# Patient Record
Sex: Female | Born: 1955 | Race: White | Hispanic: No | Marital: Married | Smoking: Former smoker
Health system: Southern US, Community
[De-identification: ages and names within clinical notes are randomized; demographics above are authoritative.]

## PROBLEM LIST (undated history)

## (undated) DIAGNOSIS — E7800 Pure hypercholesterolemia, unspecified: Secondary | ICD-10-CM

## (undated) DIAGNOSIS — M779 Enthesopathy, unspecified: Secondary | ICD-10-CM

## (undated) DIAGNOSIS — M545 Low back pain, unspecified: Secondary | ICD-10-CM

## (undated) DIAGNOSIS — M778 Other enthesopathies, not elsewhere classified: Secondary | ICD-10-CM

## (undated) DIAGNOSIS — C801 Malignant (primary) neoplasm, unspecified: Secondary | ICD-10-CM

## (undated) DIAGNOSIS — E039 Hypothyroidism, unspecified: Secondary | ICD-10-CM

## (undated) DIAGNOSIS — G629 Polyneuropathy, unspecified: Secondary | ICD-10-CM

## (undated) DIAGNOSIS — E785 Hyperlipidemia, unspecified: Secondary | ICD-10-CM

## (undated) HISTORY — DX: Hypothyroidism, unspecified: E03.9

## (undated) HISTORY — PX: WISDOM TOOTH EXTRACTION: SHX21

## (undated) HISTORY — PX: MASTECTOMY: SHX3

## (undated) HISTORY — DX: Hyperlipidemia, unspecified: E78.5

---

## 1993-06-07 HISTORY — PX: TUBAL LIGATION: SHX77

## 2006-09-14 ENCOUNTER — Ambulatory Visit: Payer: Self-pay | Admitting: Obstetrics and Gynecology

## 2010-12-29 ENCOUNTER — Ambulatory Visit: Payer: Self-pay | Admitting: Internal Medicine

## 2010-12-29 ENCOUNTER — Emergency Department: Payer: Self-pay | Admitting: *Deleted

## 2011-03-28 ENCOUNTER — Ambulatory Visit: Payer: Self-pay

## 2011-10-31 ENCOUNTER — Ambulatory Visit: Payer: Self-pay | Admitting: Family Medicine

## 2011-10-31 LAB — WET PREP, GENITAL

## 2013-09-21 ENCOUNTER — Ambulatory Visit: Payer: Self-pay | Admitting: General Practice

## 2015-02-27 ENCOUNTER — Other Ambulatory Visit: Payer: Self-pay | Admitting: Physician Assistant

## 2015-02-27 DIAGNOSIS — Z1231 Encounter for screening mammogram for malignant neoplasm of breast: Secondary | ICD-10-CM

## 2015-03-03 ENCOUNTER — Telehealth: Payer: Self-pay | Admitting: Gastroenterology

## 2015-03-03 ENCOUNTER — Other Ambulatory Visit: Payer: Self-pay

## 2015-03-03 NOTE — Telephone Encounter (Signed)
-----   Message from Evette Cristal sent at 02/27/2015 10:46 AM EDT ----- Colon triage

## 2015-03-03 NOTE — Telephone Encounter (Signed)
Gastroenterology Pre-Procedure Review  Request Date:  Requesting Physician: Dr.   PATIENT REVIEW QUESTIONS: The patient responded to the following health history questions as indicated:    1. Are you having any GI issues? no 2. Do you have a personal history of Polyps? no 3. Do you have a family history of Colon Cancer or Polyps? yes (Mother polyps) 4. Diabetes Mellitus? no 5. Joint replacements in the past 12 months?no 6. Major health problems in the past 3 months?no 7. Any artificial heart valves, MVP, or defibrillator?no    MEDICATIONS & ALLERGIES:    Patient reports the following regarding taking any anticoagulation/antiplatelet therapy:   Plavix, Coumadin, Eliquis, Xarelto, Lovenox, Pradaxa, Brilinta, or Effient? no Aspirin? no  Patient confirms/reports the following medications:  Synthroid Vit D once a week No current outpatient prescriptions on file.   No current facility-administered medications for this visit.    Patient confirms/reports the following allergies:  Allergies not on file  No orders of the defined types were placed in this encounter.    AUTHORIZATION INFORMATION Primary Insurance: 1D#: Group #:  Secondary Insurance: 1D#: Group #:  SCHEDULE INFORMATION: Date:  Time: Location:

## 2015-03-04 ENCOUNTER — Ambulatory Visit
Admission: RE | Admit: 2015-03-04 | Discharge: 2015-03-04 | Disposition: A | Discharge status: Home / Self Care | Payer: PRIVATE HEALTH INSURANCE | Source: Ambulatory Visit | Attending: Physician Assistant | Admitting: Physician Assistant

## 2015-03-04 DIAGNOSIS — Z1231 Encounter for screening mammogram for malignant neoplasm of breast: Secondary | ICD-10-CM | POA: Diagnosis not present

## 2015-03-05 ENCOUNTER — Other Ambulatory Visit: Payer: Self-pay | Admitting: Physician Assistant

## 2015-03-05 DIAGNOSIS — R928 Other abnormal and inconclusive findings on diagnostic imaging of breast: Secondary | ICD-10-CM

## 2015-03-05 DIAGNOSIS — N63 Unspecified lump in unspecified breast: Secondary | ICD-10-CM

## 2015-03-06 ENCOUNTER — Encounter: Payer: Self-pay | Admitting: Physician Assistant

## 2015-03-06 ENCOUNTER — Telehealth: Payer: Self-pay | Admitting: Physician Assistant

## 2015-03-06 ENCOUNTER — Ambulatory Visit: Payer: Self-pay | Admitting: Physician Assistant

## 2015-03-06 VITALS — BP 120/70 | HR 81 | Temp 98.6°F

## 2015-03-06 DIAGNOSIS — E785 Hyperlipidemia, unspecified: Secondary | ICD-10-CM

## 2015-03-06 DIAGNOSIS — N63 Unspecified lump in unspecified breast: Secondary | ICD-10-CM

## 2015-03-06 DIAGNOSIS — E7800 Pure hypercholesterolemia, unspecified: Secondary | ICD-10-CM

## 2015-03-06 DIAGNOSIS — E039 Hypothyroidism, unspecified: Secondary | ICD-10-CM | POA: Insufficient documentation

## 2015-03-06 DIAGNOSIS — I749 Embolism and thrombosis of unspecified artery: Secondary | ICD-10-CM

## 2015-03-06 DIAGNOSIS — H34211 Partial retinal artery occlusion, right eye: Secondary | ICD-10-CM | POA: Insufficient documentation

## 2015-03-06 MED ORDER — SIMVASTATIN 10 MG PO TABS: 10.0000 mg | ORAL_TABLET | Freq: Every day | ORAL | Status: DC

## 2015-03-06 NOTE — Progress Notes (Signed)
S:  Pt here to go over labs and discuss recent dx of Hollenh[place] plaque in r eye, also mammogram results. Pt has appointment for screening colonoscopy, had carotid US today, needs order for echocardiogram, we have also scheduled f/u of breast mass on mammogram,   O: vitals wnl, lungs c t a, cv rrr, no bruits noted at carotids  A:  Hypercholesterolemia, breast mass, vit d deficiency, hypothyroidism,   P:  Simvastatin, continue vit d, synthroid, keep appointments that are already scheduled with breast center and dr Allen Norris for colonoscopy, have ordered echo

## 2015-03-06 NOTE — Telephone Encounter (Signed)
I tried to call patient about breast mass f/u and lab review.  There was no voicemail set upon patients cell phone, no answer on cell phone, and patient was not at work.  Will try to call again later

## 2015-03-07 ENCOUNTER — Ambulatory Visit
Admission: RE | Admit: 2015-03-07 | Discharge: 2015-03-07 | Disposition: A | Discharge status: Home / Self Care | Payer: PRIVATE HEALTH INSURANCE | Source: Ambulatory Visit | Attending: Physician Assistant | Admitting: Physician Assistant

## 2015-03-07 DIAGNOSIS — I749 Embolism and thrombosis of unspecified artery: Secondary | ICD-10-CM

## 2015-03-07 NOTE — Progress Notes (Signed)
*  PRELIMINARY RESULTS* Echocardiogram 2D Echocardiogram has been performed.  Laurie Higgins 03/07/2015, 12:56 PM

## 2015-03-11 ENCOUNTER — Encounter: Payer: Self-pay | Admitting: *Deleted

## 2015-03-13 ENCOUNTER — Ambulatory Visit
Admission: RE | Admit: 2015-03-13 | Discharge: 2015-03-13 | Disposition: A | Discharge status: Home / Self Care | Payer: PRIVATE HEALTH INSURANCE | Source: Ambulatory Visit | Attending: Physician Assistant | Admitting: Physician Assistant

## 2015-03-13 DIAGNOSIS — R928 Other abnormal and inconclusive findings on diagnostic imaging of breast: Secondary | ICD-10-CM

## 2015-03-13 DIAGNOSIS — N63 Unspecified lump in unspecified breast: Secondary | ICD-10-CM

## 2015-03-13 NOTE — Progress Notes (Signed)
I spoke with patient about her echo results and she expressed understanding.  Pt requested that I send a copy to Dr. Charlann Boxer at [facility].

## 2015-03-13 NOTE — Progress Notes (Signed)
Attempted to call pt but voicemail is not set up.

## 2015-03-14 NOTE — Discharge Instructions (Signed)

## 2015-03-17 ENCOUNTER — Encounter
Admission: RE | Disposition: A | Discharge status: Home / Self Care | Payer: Self-pay | Source: Ambulatory Visit | Attending: Gastroenterology

## 2015-03-17 ENCOUNTER — Ambulatory Visit
Admission: RE | Admit: 2015-03-17 | Discharge: 2015-03-17 | Disposition: A | Discharge status: Home / Self Care | Payer: PRIVATE HEALTH INSURANCE | Source: Ambulatory Visit | Attending: Gastroenterology | Admitting: Gastroenterology

## 2015-03-17 ENCOUNTER — Ambulatory Visit: Payer: PRIVATE HEALTH INSURANCE | Admitting: Anesthesiology

## 2015-03-17 ENCOUNTER — Other Ambulatory Visit: Payer: Self-pay | Admitting: Gastroenterology

## 2015-03-17 DIAGNOSIS — Z1211 Encounter for screening for malignant neoplasm of colon: Secondary | ICD-10-CM | POA: Diagnosis not present

## 2015-03-17 DIAGNOSIS — K5730 Diverticulosis of large intestine without perforation or abscess without bleeding: Secondary | ICD-10-CM | POA: Insufficient documentation

## 2015-03-17 DIAGNOSIS — D125 Benign neoplasm of sigmoid colon: Secondary | ICD-10-CM | POA: Diagnosis not present

## 2015-03-17 HISTORY — DX: Other enthesopathies, not elsewhere classified: M77.8

## 2015-03-17 HISTORY — DX: Pure hypercholesterolemia, unspecified: E78.00

## 2015-03-17 HISTORY — DX: Enthesopathy, unspecified: M77.9

## 2015-03-17 HISTORY — DX: Low back pain: M54.5

## 2015-03-17 HISTORY — PX: COLONOSCOPY WITH PROPOFOL: SHX5780

## 2015-03-17 HISTORY — DX: Low back pain, unspecified: M54.50

## 2015-03-17 SURGERY — COLONOSCOPY WITH PROPOFOL
Anesthesia: Monitor Anesthesia Care | Wound class: Dirty or Infected

## 2015-03-17 MED ORDER — LACTATED RINGERS IV SOLN
INTRAVENOUS | Status: DC
  Administered 2015-03-17: 09:00:00 via INTRAVENOUS

## 2015-03-17 MED ORDER — STERILE WATER FOR IRRIGATION IR SOLN
Status: DC | PRN
  Administered 2015-03-17: 09:00:00

## 2015-03-17 MED ORDER — OXYCODONE HCL 5 MG/5ML PO SOLN: 5.0000 mg | Freq: Once | ORAL | Status: DC | PRN

## 2015-03-17 MED ORDER — OXYCODONE HCL 5 MG PO TABS: 5.0000 mg | ORAL_TABLET | Freq: Once | ORAL | Status: DC | PRN

## 2015-03-17 MED ORDER — PROPOFOL 10 MG/ML IV BOLUS
INTRAVENOUS | Status: DC | PRN
  Administered 2015-03-17 (×4): 50 mg via INTRAVENOUS
  Administered 2015-03-17: 150 mg via INTRAVENOUS

## 2015-03-17 MED ORDER — SODIUM CHLORIDE 0.9 % IV SOLN: INTRAVENOUS | Status: DC

## 2015-03-17 MED ORDER — LIDOCAINE HCL (CARDIAC) 20 MG/ML IV SOLN
INTRAVENOUS | Status: DC | PRN
  Administered 2015-03-17: 30 mg via INTRAVENOUS

## 2015-03-17 SURGICAL SUPPLY — 28 items
CANISTER SUCT 1200ML W/VALVE (MISCELLANEOUS) ×3 IMPLANT
FCP ESCP3.2XJMB 240X2.8X (MISCELLANEOUS)
FORCEPS BIOP RAD 4 LRG CAP 4 (CUTTING FORCEPS) ×3 IMPLANT
FORCEPS BIOP RJ4 240 W/NDL (MISCELLANEOUS)
FORCEPS ESCP3.2XJMB 240X2.8X (MISCELLANEOUS) IMPLANT
GOWN CVR UNV OPN BCK APRN NK (MISCELLANEOUS) ×2 IMPLANT
GOWN ISOL THUMB LOOP REG UNIV (MISCELLANEOUS) ×4
HEMOCLIP INSTINCT (CLIP) IMPLANT
INJECTOR VARIJECT VIN23 (MISCELLANEOUS) IMPLANT
KIT CO2 TUBING (TUBING) IMPLANT
KIT DEFENDO VALVE AND CONN (KITS) IMPLANT
KIT ENDO PROCEDURE OLY (KITS) ×3 IMPLANT
LIGATOR MULTIBAND 6SHOOTER MBL (MISCELLANEOUS) IMPLANT
MARKER SPOT ENDO TATTOO 5ML (MISCELLANEOUS) IMPLANT
PAD GROUND ADULT SPLIT (MISCELLANEOUS) IMPLANT
SNARE SHORT THROW 13M SML OVAL (MISCELLANEOUS) ×3 IMPLANT
SNARE SHORT THROW 30M LRG OVAL (MISCELLANEOUS) IMPLANT
SPOT EX ENDOSCOPIC TATTOO (MISCELLANEOUS)
SUCTION POLY TRAP 4CHAMBER (MISCELLANEOUS) IMPLANT
TRAP SUCTION POLY (MISCELLANEOUS) IMPLANT
TUBING CONN 6MMX3.1M (TUBING)
TUBING SUCTION CONN 0.25 STRL (TUBING) IMPLANT
UNDERPAD 30X60 958B10 (PK) (MISCELLANEOUS) IMPLANT
VALVE BIOPSY ENDO (VALVE) IMPLANT
VARIJECT INJECTOR VIN23 (MISCELLANEOUS)
WATER AUXILLARY (MISCELLANEOUS) IMPLANT
WATER STERILE IRR 250ML POUR (IV SOLUTION) ×3 IMPLANT
WATER STERILE IRR 500ML POUR (IV SOLUTION) IMPLANT

## 2015-03-17 NOTE — Anesthesia Preprocedure Evaluation (Signed)
Anesthesia Evaluation  Patient identified by MRN, date of birth, ID band  Reviewed: NPO status   History of Anesthesia Complications Negative for: history of anesthetic complications  Airway Mallampati: II  TM Distance: >3 FB Neck ROM: full    Dental no notable dental hx.    Pulmonary Current Smoker,    Pulmonary exam normal        Cardiovascular Exercise Tolerance: Good negative cardio ROS Normal cardiovascular exam     Neuro/Psych R eye plaque  negative psych ROS   GI/Hepatic Neg liver ROS, GERD  Controlled,  Endo/Other  Hypothyroidism Morbid obesity  Renal/GU negative Renal ROS  negative genitourinary   Musculoskeletal   Abdominal   Peds  Hematology negative hematology ROS (+)   Anesthesia Other Findings   Reproductive/Obstetrics                             Anesthesia Physical Anesthesia Plan  ASA: II  Anesthesia Plan: MAC   Post-op Pain Management:    Induction:   Airway Management Planned:   Additional Equipment:   Intra-op Plan:   Post-operative Plan:   Informed Consent: I have reviewed the patients History and Physical, chart, labs and discussed the procedure including the risks, benefits and alternatives for the proposed anesthesia with the patient or authorized representative who has indicated his/her understanding and acceptance.     Plan Discussed with: CRNA  Anesthesia Plan Comments:         Anesthesia Quick Evaluation

## 2015-03-17 NOTE — Anesthesia Postprocedure Evaluation (Signed)
  Anesthesia Post-op Note  Patient: Laurie Higgins  Procedure(s) Performed: Procedure(s) with comments: COLONOSCOPY WITH PROPOFOL (N/A) - SIGMOID COLON BX  X  4  Anesthesia type:MAC  Patient location: PACU  Post pain: Pain level controlled  Post assessment: Post-op Vital signs reviewed, Patient's Cardiovascular Status Stable, Respiratory Function Stable, Patent Airway and No signs of Nausea or vomiting  Post vital signs: Reviewed and stable  Last Vitals:  Filed Vitals:   03/17/15 0947  BP:   Pulse: 71  Temp:   Resp:     Level of consciousness: awake, alert  and patient cooperative  Complications: No apparent anesthesia complications

## 2015-03-17 NOTE — H&P (Signed)
  [facility]  [address] [place], [place] [address] Phone: [phone] Fax : [phone]  Primary Care Physician:  No primary care provider on file. Primary Gastroenterologist:  Dr. Allen Norris  Pre-Procedure History & Physical: HPI:  Laurie Higgins is a 59 y.o. female is here for a screening colonoscopy.   Past Medical History  Diagnosis Date  . Hypothyroid   . Hyperlipidemia   . Hypercholesteremia   . Back pain, lumbosacral     muscular vs spinal, sees chiropractor sometimes  . Capsulitis of left foot     Past Surgical History  Procedure Laterality Date  . Cesarean section  06/08/1988  . Tubal ligation Bilateral 1995  . Wisdom tooth extraction      age 83    Prior to Admission medications   Medication Sig Start Date End Date Taking? Authorizing Provider  Ascorbic Acid (VITAMIN C PO) Take by mouth.   Yes Historical Provider, MD  aspirin 81 MG tablet Take 81 mg by mouth 2 (two) times daily.   Yes Historical Provider, MD  Coenzyme Q10 (CO Q-10 PO) Take by mouth.   Yes Historical Provider, MD  levothyroxine (SYNTHROID, LEVOTHROID) 125 MCG tablet Take 125 mcg by mouth daily before breakfast.   Yes Historical Provider, MD  Multiple Vitamin (MULTIVITAMIN) tablet Take 1 tablet by mouth daily.   Yes Historical Provider, MD  Multiple Vitamins-Minerals (ZINC PO) Take by mouth.   Yes Historical Provider, MD  simvastatin (ZOCOR) 10 MG tablet Take 1 tablet (10 mg total) by mouth at bedtime. 03/06/15  Yes Versie Starks, PA-C  Vitamin D, Ergocalciferol, (DRISDOL) 50000 UNITS CAPS capsule Take 50,000 Units by mouth every 7 (seven) days.   Yes Historical Provider, MD    Allergies as of 03/03/2015  . (Not on File)    Family History  Problem Relation Age of Onset  . Breast cancer Mother 52    Social History   Social History  . Marital Status: Married    Spouse Name: N/A  . Number of Children: N/A  . Years of Education: N/A   Occupational History  . Not on  file.   Social History Main Topics  . Smoking status: Current Every Day Smoker -- 1.00 packs/day for 40 years    Types: Cigarettes  . Smokeless tobacco: Not on file  . Alcohol Use: No  . Drug Use: Not on file  . Sexual Activity: Not on file   Other Topics Concern  . Not on file   Social History Narrative    Review of Systems: See HPI, otherwise negative ROS  Physical Exam: Ht 5' 5.5" (1.664 m)  Wt 218 lb (98.884 kg)  BMI 35.71 kg/m2 General:   Alert,  pleasant and cooperative in NAD Head:  Normocephalic and atraumatic. Neck:  Supple; no masses or thyromegaly. Lungs:  Clear throughout to auscultation.    Heart:  Regular rate and rhythm. Abdomen:  Soft, nontender and nondistended. Normal bowel sounds, without guarding, and without rebound.   Neurologic:  Alert and  oriented x4;  grossly normal neurologically.  Impression/Plan: Laurie Higgins is now here to undergo a screening colonoscopy.  Risks, benefits, and alternatives regarding colonoscopy have been reviewed with the patient.  Questions have been answered.  All parties agreeable.

## 2015-03-17 NOTE — Op Note (Signed)
[facility]enter[facility] Patient Name: Laurie Higgins Procedure Date: 03/17/2015 8:56 AM MRN: [id number] Account #: [id number] Date of Birth: [birth year] Admit Type: Outpatient Age: 59 Room: [facility] OR ROOM 01 Gender: Female Note Status: Finalized Procedure:         Colonoscopy Indications:       Screening for colorectal malignant neoplasm Providers:         Lucilla Lame, MD Referring MD:      Versie Starks, MD (Referring MD) Medicines:         Propofol per Anesthesia Complications:     No immediate complications. Procedure:         Pre-Anesthesia Assessment:                    - Prior to the procedure, a History and Physical was                     performed, and patient medications and allergies were                     reviewed. The patient's tolerance of previous anesthesia                     was also reviewed. The risks and benefits of the procedure                     and the sedation options and risks were discussed with the                     patient. All questions were answered, and informed consent                     was obtained. Prior Anticoagulants: The patient has taken                     no previous anticoagulant or antiplatelet agents. ASA                     Grade Assessment: II - A patient with mild systemic                     disease. After reviewing the risks and benefits, the                     patient was deemed in satisfactory condition to undergo                     the procedure.                    After obtaining informed consent, the colonoscope was                     passed under direct vision. Throughout the procedure, the                     patient's blood pressure, pulse, and oxygen saturations                     were monitored continuously. The Olympus CF-HQ190L                     Colonoscope (S#. [id number]) was introduced through the anus  and advanced to the the cecum, identified by appendiceal                orifice and ileocecal valve. The colonoscopy was performed                     without difficulty. The patient tolerated the procedure                     well. The quality of the bowel preparation was excellent. Findings:      The perianal and digital rectal examinations were normal.      A 3 mm polyp was found in the sigmoid colon. The polyp was sessile. The       polyp was removed with a cold biopsy forceps. Resection and retrieval       were complete.      Three sessile polyps were found in the sigmoid colon. The polyps were 6       to 9 mm in size. These polyps were removed with a cold snare. Resection       and retrieval were complete.      A few small-mouthed diverticula were found in the ascending colon. Impression:        - One 3 mm polyp in the sigmoid colon. Resected and                     retrieved.                    - Three 6 to 9 mm polyps in the sigmoid colon. Resected                     and retrieved.                    - Diverticulosis in the ascending colon. Recommendation:    - Await pathology results.                    - Repeat colonoscopy in 3 years if polyp adenoma and 10                     years if hyperplastic Procedure Code(s): --- Professional ---                    [phone], Colonoscopy, flexible; with removal of tumor(s),                     polyp(s), or other lesion(s) by snare technique                    45380, 6, Colonoscopy, flexible; with biopsy, single or                     multiple Diagnosis Code(s): --- Professional ---                    D12.5, Benign neoplasm of sigmoid colon                    Z12.11, Encounter for screening for malignant neoplasm of                     colon CPT copyright 2014 American Medical Association. All rights reserved. The codes documented in this report are preliminary and upon coder review may  be revised to meet  current compliance requirements. Lucilla Lame, MD 03/17/2015 9:35:36 AM This report has  been signed electronically. Number of Addenda: 0 Note Initiated On: 03/17/2015 8:56 AM Scope Withdrawal Time: 0 hours 8 minutes 10 seconds  Total Procedure Duration: 0 hours 11 minutes 5 seconds       [facility]

## 2015-03-17 NOTE — Transfer of Care (Signed)
Immediate Anesthesia Transfer of Care Note  Patient: Laurie Higgins  Procedure(s) Performed: Procedure(s) with comments: COLONOSCOPY WITH PROPOFOL (N/A) - SIGMOID COLON BX  X  4  Patient Location: PACU  Anesthesia Type: MAC  Level of Consciousness: awake, alert  and patient cooperative  Airway and Oxygen Therapy: Patient Spontanous Breathing and Patient connected to supplemental oxygen  Post-op Assessment: Post-op Vital signs reviewed, Patient's Cardiovascular Status Stable, Respiratory Function Stable, Patent Airway and No signs of Nausea or vomiting  Post-op Vital Signs: Reviewed and stable  Complications: No apparent anesthesia complications

## 2015-03-18 ENCOUNTER — Encounter: Payer: Self-pay | Admitting: Gastroenterology

## 2015-03-25 ENCOUNTER — Encounter: Payer: Self-pay | Admitting: Gastroenterology

## 2015-04-21 ENCOUNTER — Other Ambulatory Visit: Payer: Self-pay | Admitting: Emergency Medicine

## 2015-04-21 DIAGNOSIS — E559 Vitamin D deficiency, unspecified: Secondary | ICD-10-CM

## 2015-04-21 MED ORDER — VITAMIN D (ERGOCALCIFEROL) 1.25 MG (50000 UNIT) PO CAPS: 50000.0000 [IU] | ORAL_CAPSULE | ORAL | Status: DC

## 2015-04-21 NOTE — Telephone Encounter (Signed)
Received a faxed medication request from [facility] in [place].  Please advise.  Thank you.

## 2015-04-21 NOTE — Telephone Encounter (Signed)
Med refill approved 

## 2015-04-28 ENCOUNTER — Other Ambulatory Visit: Payer: Self-pay

## 2015-04-28 DIAGNOSIS — Z299 Encounter for prophylactic measures, unspecified: Secondary | ICD-10-CM

## 2015-04-28 NOTE — Progress Notes (Signed)
Patient came in to have blood drawn per Susan's order.  Blood was drawn from left arm without any incident. Patient wants to be called when the results are finalized.

## 2015-04-29 ENCOUNTER — Encounter: Payer: Self-pay | Admitting: Physician Assistant

## 2015-04-29 LAB — LIPID PANEL
CHOLESTEROL TOTAL: 156 mg/dL (ref 100–199)
Chol/HDL Ratio: 3.4 ratio units (ref 0.0–4.4)
HDL: 46 mg/dL (ref >39)
LDL CALC: 93 mg/dL (ref 0–99)
TRIGLYCERIDES: 85 mg/dL (ref 0–149)
VLDL Cholesterol Cal: 17 mg/dL (ref 5–40)

## 2015-04-29 LAB — HEPATIC FUNCTION PANEL
ALBUMIN: 3.9 g/dL (ref 3.5–5.5)
ALK PHOS: 72 IU/L (ref 39–117)
ALT: 21 IU/L (ref 0–32)
AST: 20 IU/L (ref 0–40)
Bilirubin Total: 0.3 mg/dL (ref 0.0–1.2)
Bilirubin, Direct: 0.09 mg/dL (ref 0.00–0.40)
TOTAL PROTEIN: 6 g/dL (ref 6.0–8.5)

## 2015-04-29 LAB — THYROID PANEL WITH TSH
Free Thyroxine Index: 4.2 (ref 1.2–4.9)
T3 UPTAKE RATIO: 29 % (ref 24–39)
T4 TOTAL: 14.5 ug/dL — AB (ref 4.5–12.0)
TSH: 0.34 u[IU]/mL — AB (ref 0.450–4.500)

## 2015-04-29 NOTE — Progress Notes (Signed)
I spoke with the patient about her lab results and she expressed understanding.  Per Susan's order I will refer the patient to see the Endocrinologist at [facility].

## 2015-04-29 NOTE — Patient Instructions (Signed)
Hyperthyroidism Hyperthyroidism is when the thyroid is too active (overactive). Your thyroid is a large gland that is located in your neck. The thyroid helps to control how your body uses food (metabolism). When your thyroid is overactive, it produces too much of a hormone called thyroxine.  CAUSES Causes of hyperthyroidism may include:  Graves disease. This is when your immune system attacks the thyroid gland. This is the most common cause.  Inflammation of the thyroid gland.  Tumor in the thyroid gland or somewhere else.  Excessive use of thyroid medicines, including:  Prescription thyroid supplement.  Herbal supplements that mimic thyroid hormones.  Solid or fluid-filled lumps within your thyroid gland (thyroid nodules).  Excessive ingestion of iodine. RISK FACTORS  Being female.  Having a family history of thyroid conditions. SIGNS AND SYMPTOMS Signs and symptoms of hyperthyroidism may include:  Nervousness.  Inability to tolerate heat.  Unexplained weight loss.  Diarrhea.  Change in the texture of hair or skin.  Heart skipping beats or making extra beats.  Rapid heart rate.  Loss of menstruation.  Shaky hands.  Fatigue.  Restlessness.  Increased appetite.  Sleep problems.  Enlarged thyroid gland or nodules. DIAGNOSIS  Diagnosis of hyperthyroidism may include:  Medical history and physical exam.  Blood tests.  Ultrasound tests. TREATMENT Treatment may include:  Medicines to control your thyroid.  Surgery to remove your thyroid.  Radiation therapy. HOME CARE INSTRUCTIONS   Take medicines only as directed by your health care provider.  Do not use any tobacco products, including cigarettes, chewing tobacco, or electronic cigarettes. If you need help quitting, ask your health care provider.  Do not exercise or do physical activity until your health care provider approves.  Keep all follow-up appointments as directed by your health care  provider. This is important. SEEK MEDICAL CARE IF:  Your symptoms do not get better with treatment.  You have fever.  You are taking thyroid replacement medicine and you:  Have depression.  Feel mentally and physically slow.  Have weight gain. SEEK IMMEDIATE MEDICAL CARE IF:   You have decreased alertness or a change in your awareness.  You have abdominal pain.  You feel dizzy.  You have a rapid heartbeat.  You have an irregular heartbeat.   This information is not intended to replace advice given to you by your health care provider. Make sure you discuss any questions you have with your health care provider.   Document Released: 05/24/2005 Document Revised: 06/14/2014 Document Reviewed: 10/09/2013 Elsevier Interactive Patient Education 2016 Elsevier Inc.  

## 2015-05-16 ENCOUNTER — Other Ambulatory Visit: Payer: Self-pay

## 2015-05-16 DIAGNOSIS — E039 Hypothyroidism, unspecified: Secondary | ICD-10-CM

## 2015-05-16 NOTE — Progress Notes (Signed)
Patient came in to have blood drawn per Dr Gabriel Carina at [facility].  Blood was drawn from right arm without any incident. Patient wants lab results sent to Dr. Gabriel Carina when they are finalized.

## 2015-05-17 LAB — THYROID PEROXIDASE ANTIBODY

## 2015-05-17 LAB — TSH: TSH: 1.66 u[IU]/mL (ref 0.450–4.500)

## 2015-09-18 ENCOUNTER — Other Ambulatory Visit: Payer: Self-pay | Admitting: Emergency Medicine

## 2015-09-18 NOTE — Telephone Encounter (Signed)
Received a faxed medication request from [facility].  Please advise.  Thank you.

## 2015-10-17 ENCOUNTER — Other Ambulatory Visit: Payer: Self-pay

## 2015-10-17 DIAGNOSIS — Z299 Encounter for prophylactic measures, unspecified: Secondary | ICD-10-CM

## 2015-10-17 NOTE — Progress Notes (Signed)
Patient came in to have blood drawn so that she can get her thyroid medication refilled.  Blood was drawn from the right arm without any incident.

## 2015-10-18 LAB — CMP12+LP+TP+TSH+6AC+CBC/D/PLT
ALBUMIN: 4.2 g/dL (ref 3.5–5.5)
ALT: 21 IU/L (ref 0–32)
AST: 20 IU/L (ref 0–40)
Albumin/Globulin Ratio: 2 (ref 1.2–2.2)
Alkaline Phosphatase: 72 IU/L (ref 39–117)
BUN/Creatinine Ratio: 23 (ref 9–23)
BUN: 16 mg/dL (ref 6–24)
Basophils Absolute: 0 10*3/uL (ref 0.0–0.2)
Basos: 0 %
Bilirubin Total: 0.4 mg/dL (ref 0.0–1.2)
CALCIUM: 9.2 mg/dL (ref 8.7–10.2)
CHOLESTEROL TOTAL: 208 mg/dL — AB (ref 100–199)
Chloride: 103 mmol/L (ref 96–106)
Chol/HDL Ratio: 4.8 ratio units — ABNORMAL HIGH (ref 0.0–4.4)
Creatinine, Ser: 0.71 mg/dL (ref 0.57–1.00)
EOS (ABSOLUTE): 0.2 10*3/uL (ref 0.0–0.4)
Eos: 3 %
Estimated CHD Risk: 1.2 times avg. — ABNORMAL HIGH (ref 0.0–1.0)
FREE THYROXINE INDEX: 3.7 (ref 1.2–4.9)
GFR calc Af Amer: 109 mL/min/{1.73_m2} (ref >59)
GFR calc non Af Amer: 95 mL/min/{1.73_m2} (ref >59)
GGT: 73 IU/L — AB (ref 0–60)
GLOBULIN, TOTAL: 2.1 g/dL (ref 1.5–4.5)
Glucose: 104 mg/dL — ABNORMAL HIGH (ref 65–99)
HDL: 43 mg/dL (ref >39)
Hematocrit: 41 % (ref 34.0–46.6)
Hemoglobin: 13.9 g/dL (ref 11.1–15.9)
IMMATURE GRANS (ABS): 0 10*3/uL (ref 0.0–0.1)
Immature Granulocytes: 0 %
Iron: 92 ug/dL (ref 27–159)
LDH: 197 IU/L (ref 119–226)
LDL Calculated: 140 mg/dL — ABNORMAL HIGH (ref 0–99)
LYMPHS: 22 %
Lymphocytes Absolute: 1.9 10*3/uL (ref 0.7–3.1)
MCH: 29.8 pg (ref 26.6–33.0)
MCHC: 33.9 g/dL (ref 31.5–35.7)
MCV: 88 fL (ref 79–97)
MONOS ABS: 0.5 10*3/uL (ref 0.1–0.9)
Monocytes: 6 %
NEUTROS ABS: 5.9 10*3/uL (ref 1.4–7.0)
NEUTROS PCT: 69 %
PHOSPHORUS: 3.6 mg/dL (ref 2.5–4.5)
POTASSIUM: 5.1 mmol/L (ref 3.5–5.2)
Platelets: 325 10*3/uL (ref 150–379)
RBC: 4.67 x10E6/uL (ref 3.77–5.28)
RDW: 13.7 % (ref 12.3–15.4)
Sodium: 143 mmol/L (ref 134–144)
T3 Uptake Ratio: 24 % (ref 24–39)
T4 TOTAL: 15.3 ug/dL — AB (ref 4.5–12.0)
TRIGLYCERIDES: 125 mg/dL (ref 0–149)
TSH: 0.252 u[IU]/mL — AB (ref 0.450–4.500)
Total Protein: 6.3 g/dL (ref 6.0–8.5)
Uric Acid: 5.9 mg/dL (ref 2.5–7.1)
VLDL Cholesterol Cal: 25 mg/dL (ref 5–40)
WBC: 8.5 10*3/uL (ref 3.4–10.8)

## 2015-10-18 LAB — VITAMIN D 25 HYDROXY (VIT D DEFICIENCY, FRACTURES): Vit D, 25-Hydroxy: 24.6 ng/mL — ABNORMAL LOW (ref 30.0–100.0)

## 2015-10-22 ENCOUNTER — Other Ambulatory Visit: Payer: Self-pay | Admitting: Physician Assistant

## 2015-10-22 MED ORDER — LEVOTHYROXINE SODIUM 75 MCG PO TABS: 75.0000 ug | ORAL_TABLET | Freq: Every day | ORAL | Status: DC

## 2015-11-14 ENCOUNTER — Other Ambulatory Visit: Payer: Self-pay

## 2015-11-14 DIAGNOSIS — Z299 Encounter for prophylactic measures, unspecified: Secondary | ICD-10-CM

## 2015-11-14 NOTE — Progress Notes (Signed)
Patient came in to have blood drawn per [facility] authorization.  We had to recheck her TSH levels since Manuela Schwartz adjusted her medication dose one month ago.  Blood was drawn from the right arm without any incident.

## 2015-11-15 LAB — THYROID PANEL WITH TSH
FREE THYROXINE INDEX: 2.3 (ref 1.2–4.9)
T3 UPTAKE RATIO: 23 % — AB (ref 24–39)
T4, Total: 10.1 ug/dL (ref 4.5–12.0)
TSH: 4.54 u[IU]/mL — ABNORMAL HIGH (ref 0.450–4.500)

## 2015-11-17 MED ORDER — LEVOTHYROXINE SODIUM 100 MCG PO TABS: 100.0000 ug | ORAL_TABLET | Freq: Every day | ORAL | Status: DC

## 2015-11-17 NOTE — Progress Notes (Signed)
tsh level is high which means hypo, need to increase synthroid to 135mcg, sent to [facility], please notify patient

## 2015-11-17 NOTE — Addendum Note (Signed)
Addended by: Versie Starks on: 11/17/2015 11:38 AM   Modules accepted: Orders

## 2016-02-05 ENCOUNTER — Other Ambulatory Visit: Payer: Self-pay | Admitting: Emergency Medicine

## 2016-02-05 MED ORDER — LEVOTHYROXINE SODIUM 100 MCG PO TABS: 100.0000 ug | ORAL_TABLET | Freq: Every day | ORAL | 3 refills | Status: DC

## 2016-02-05 NOTE — Telephone Encounter (Signed)
Med refill approved 

## 2016-06-09 ENCOUNTER — Other Ambulatory Visit: Payer: Self-pay | Admitting: Emergency Medicine

## 2016-06-09 MED ORDER — LEVOTHYROXINE SODIUM 100 MCG PO TABS: 100.0000 ug | ORAL_TABLET | Freq: Every day | ORAL | 3 refills | Status: AC

## 2016-06-09 NOTE — Telephone Encounter (Signed)
Med refill approved, pt needs to have thyroid panel done prior to additional refills

## 2016-06-15 DIAGNOSIS — E669 Obesity, unspecified: Secondary | ICD-10-CM | POA: Insufficient documentation

## 2016-07-06 ENCOUNTER — Other Ambulatory Visit: Payer: Self-pay

## 2016-07-06 DIAGNOSIS — Z299 Encounter for prophylactic measures, unspecified: Secondary | ICD-10-CM

## 2016-07-06 NOTE — Addendum Note (Signed)
Addended by: Rudene Anda T on: 07/06/2016 03:36 PM   Modules accepted: Level of Service

## 2016-07-06 NOTE — Progress Notes (Signed)
Patient came in to have blood drawn for testing per Dr. Jean Dostou's orders. 

## 2016-07-07 LAB — TSH: TSH: 3.84 u[IU]/mL (ref 0.450–4.500)

## 2016-08-09 ENCOUNTER — Ambulatory Visit: Payer: Self-pay | Admitting: Physician Assistant

## 2016-08-09 ENCOUNTER — Encounter: Payer: Self-pay | Admitting: Physician Assistant

## 2016-08-09 VITALS — BP 129/80 | HR 84 | Temp 98.4°F

## 2016-08-09 DIAGNOSIS — H1013 Acute atopic conjunctivitis, bilateral: Secondary | ICD-10-CM

## 2016-08-09 MED ORDER — OLOPATADINE HCL 0.1 % OP SOLN: 1.0000 [drp] | Freq: Two times a day (BID) | OPHTHALMIC | 1 refills | Status: DC

## 2016-08-09 NOTE — Progress Notes (Signed)
   Subjective:pink eye    Patient ID: Laurie Higgins, female    DOB: 08/01/1955, 61 y.o.   MRN: [id number]  HPI Patient c/o watery eyes and yellowish discharge for 2 days. Matted eyelids this morning. Denies vision loss. No eye contact usage. Onset after working in garden this past weekend.   Review of Systems yperthyroidism and Hyperlipidema    Objective:   Physical Exam Erythematous conjunctiva with dry secretion. PERRL and EOM intact.        Assessment & Plan:Allergic conjunctivitis  Patanol eye drops as directed and work excuse.  Follow up PRN

## 2016-09-30 ENCOUNTER — Ambulatory Visit: Payer: Self-pay | Admitting: Physician Assistant

## 2016-09-30 ENCOUNTER — Encounter: Payer: Self-pay | Admitting: Physician Assistant

## 2016-09-30 VITALS — BP 125/94 | HR 78 | Temp 98.4°F | Ht 65.0 in | Wt 231.0 lb

## 2016-09-30 DIAGNOSIS — Z0000 Encounter for general adult medical examination without abnormal findings: Secondary | ICD-10-CM

## 2016-09-30 NOTE — Progress Notes (Signed)
S: pt here for wellness physical for insurance purposes, had an appt with her pcp but had to change it and its in July which would be too late for insurance;  no complaints ros neg. PMH:  Hypothyroidism, hyperlipidemia  Social: smoker, no etoh, no drugs Fam: father has bladder CA, htn, mother had breast CA  O: vitals wnl, nad, ENT wnl, neck supple no lymph, lungs c t a, cv rrr, abd soft nontender bs normal all 4 quads  A: wellness exam  P: f/u prn

## 2016-10-19 ENCOUNTER — Ambulatory Visit: Payer: Self-pay | Admitting: Physician Assistant

## 2016-10-19 ENCOUNTER — Encounter: Payer: Self-pay | Admitting: Physician Assistant

## 2016-10-19 VITALS — BP 120/70 | HR 74 | Temp 98.5°F

## 2016-10-19 DIAGNOSIS — M25561 Pain in right knee: Secondary | ICD-10-CM

## 2016-10-19 MED ORDER — MELOXICAM 15 MG PO TABS
15.0000 mg | ORAL_TABLET | Freq: Every day | ORAL | 3 refills | Status: DC
Start: 2016-10-19 — End: 2016-12-28

## 2016-10-19 NOTE — Progress Notes (Signed)
Per Susan's authorization I referred the patient to [facility] in [place] to see Dr. Hulan Saas on 11/16/2016 @ 3pm.  Patient's husband was given the appointment information and he expressed that he will let the patient know.

## 2016-10-19 NOTE — Progress Notes (Signed)
S: c/o R knee pain, states sx for a month, no known injury, has pain and swelling, feels like a rubber band is wrapped around her knee and feels like it wants to slip forward, no numbness or tingling, no fever/chills, is making it difficult to kneel or squat while gardening  O: vitals wnl, nad, skin intact, no redness, some swelling a joint line and suprapatellar, pain reproduced with extension of knee, no bony tenderness noted, ligaments appear intact but pt is guarding, some posterior tenderness, neg homan's sign  A: acute knee pain  P: mobic, ice, will refer to sports med

## 2016-10-25 ENCOUNTER — Encounter: Payer: Self-pay | Admitting: Physician Assistant

## 2016-11-16 ENCOUNTER — Ambulatory Visit: Payer: Self-pay | Admitting: Family Medicine

## 2016-11-28 NOTE — Progress Notes (Signed)
[name] [address] [place], [place] [address] Phone: [phone] Subjective:    I'm seeing this patient by the request  of:  Ashok Cordia  CC: Right knee pain  [id number]  Laurie Higgins is a 61 y.o. female coming in with complaint of right knee pain. Been going on for 2 months. Has had pain and swelling. Patient does not remember any true injury. Seems to hurt more when squatting on the knees discarded. Sometimes twisting motion hurts. Denies radiation down the leg no. Patient was given meloxicam as well as ice. Patient states Pain is 6 out of 10. Seems to be aching. Affecting daily activities as well as her job. Noticing she is sitting more not working out as much which is causing her to gain weight.     Past Medical History:  Diagnosis Date  . Back pain, lumbosacral    muscular vs spinal, sees chiropractor sometimes  . Capsulitis of left foot   . Hypercholesteremia   . Hyperlipidemia   . Hypothyroid    Past Surgical History:  Procedure Laterality Date  . CESAREAN SECTION  06/08/1988  . COLONOSCOPY WITH PROPOFOL N/A 03/17/2015   Procedure: COLONOSCOPY WITH PROPOFOL;  Surgeon: Lucilla Lame, MD;  Location: [facility];  Service: Endoscopy;  Laterality: N/A;  SIGMOID COLON POLYPS  X  4    . TUBAL LIGATION Bilateral 1995  . WISDOM TOOTH EXTRACTION     age 61   Social History   Social History  . Marital status: Married    Spouse name: N/A  . Number of children: N/A  . Years of education: N/A   Social History Main Topics  . Smoking status: Current Every Day Smoker    Packs/day: 1.00    Years: 40.00    Types: Cigarettes  . Smokeless tobacco: Never Used  . Alcohol use No  . Drug use: No  . Sexual activity: Not on file   Other Topics Concern  . Not on file   Social History Narrative  . No narrative on file   Allergies  Allergen Reactions  . Lactose Intolerance (Gi) Other (See Comments)    Gas/GI distress  . Sulfa  Antibiotics Hives    And swelling   Family History  Problem Relation Age of Onset  . Breast cancer Mother 26  . Bladder Cancer Father   . Hypertension Father     Past medical history, social, surgical and family history all reviewed in electronic medical record.  No pertanent information unless stated regarding to the chief complaint.   Review of Systems:Review of systems updated and as accurate as of 11/28/16  No headache, visual changes, nausea, vomiting, diarrhea, constipation, dizziness, abdominal pain, skin rash, fevers, chills, night sweats, weight loss, swollen lymph nodes, body aches,  muscle aches, chest pain, shortness of breath, mood changes. Positive joint swelling  Objective  There were no vitals taken for this visit. Systems examined below as of 11/28/16   General: No apparent distress alert and oriented x3 mood and affect normal, dressed appropriately.  HEENT: Pupils equal, extraocular movements intact  Respiratory: Patient's speak in full sentences and does not appear short of breath  Cardiovascular: No lower extremity edema, non tender, no erythema  Skin: Warm dry intact with no signs of infection or rash on extremities or on axial skeleton.  Abdomen: Soft nontender  Neuro: Cranial nerves II through XII are intact, neurovascularly intact in all extremities with 2+ DTRs and 2+ pulses.  Lymph: No lymphadenopathy of posterior or anterior cervical chain or axillae bilaterally.  Gait normal with good balance and coordination.  MSK:  Non tender with full range of motion and good stability and symmetric strength and tone of shoulders, elbows, wrist, hip and ankles bilaterally.  Knee: Right Trace effusion noted Tender over the anterior lateral meniscus and joint line. ROM full in flexion and extension and lower leg rotation. Ligaments with solid consistent endpoints including ACL, PCL, LCL, MCL. Negative Mcmurray's, Apley's, and Thessalonian tests. Mild painful patellar  compression. Patellar glide with moderate crepitus. Patellar and quadriceps tendons unremarkable. Hamstring and quadriceps strength is normal.  Contralateral knee has some crepitus but no pain  MSK US performed of: Right This study was ordered, performed, and interpreted by Charlann Boxer D.O.  Knee: Patient does have moderate osteophytic changes noted in all 3 compartments. Patient's lateral meniscus does have a tear noted. Hypoechoic changes and mild increasing in Doppler flow. Mild displacement on the anterior lateral aspect of the meniscus.  IMPRESSION:  Mildly displaced meniscal injury  Procedure note 40352; 15 minutes spent for Therapeutic exercises as stated in above notes.  This included exercises focusing on stretching, strengthening, with significant focus on eccentric aspects.  Flexion and extension exercises given. Given vastus medialis oblique as well as hip abductor strengthening. Proper technique shown and discussed handout in great detail with ATC.  All questions were discussed and answered.  \    Impression and Recommendations:     This case required medical decision making of moderate complexity.      Note: This dictation was prepared with Dragon dictation along with smaller phrase technology. Any transcriptional errors that result from this process are unintentional.

## 2016-11-29 ENCOUNTER — Ambulatory Visit: Payer: Self-pay

## 2016-11-29 ENCOUNTER — Ambulatory Visit (INDEPENDENT_AMBULATORY_CARE_PROVIDER_SITE_OTHER): Payer: Managed Care, Other (non HMO) | Admitting: Family Medicine

## 2016-11-29 ENCOUNTER — Encounter: Payer: Self-pay | Admitting: Family Medicine

## 2016-11-29 VITALS — BP 122/70 | HR 76 | Ht 65.0 in | Wt 229.0 lb

## 2016-11-29 DIAGNOSIS — M25561 Pain in right knee: Secondary | ICD-10-CM | POA: Diagnosis not present

## 2016-11-29 DIAGNOSIS — M23300 Other meniscus derangements, unspecified lateral meniscus, right knee: Secondary | ICD-10-CM | POA: Diagnosis not present

## 2016-11-29 DIAGNOSIS — M1711 Unilateral primary osteoarthritis, right knee: Secondary | ICD-10-CM

## 2016-11-29 MED ORDER — DICLOFENAC SODIUM 2 % TD SOLN: 2.0000 "application " | Freq: Two times a day (BID) | TRANSDERMAL | 3 refills | Status: AC

## 2016-11-29 NOTE — Assessment & Plan Note (Signed)
Degenerative tear noted. We discussed icing regimen and home exercises. Patient will change the oral anti-inflammatories with topical anti-inflammatory. Home exercise given and work with Product/process development scientist. We discussed icing regimen. Discussed proper shoes and what activities to avoid. Follow-up again in 4 weeks. Worsening symptoms consider injection and formal physical therapy.

## 2016-11-29 NOTE — Patient Instructions (Addendum)
Good to see you  Ice 20 minutes 2 times daily. Usually after activity and before bed. Exercises 3 times a week.  pennsaid pinkie amount topically 2 times daily as needed.  Avoid twisting motion.  Turmeric 500mg  twice daily  Tart cherry extract any dose.  Continue the vitamin D See me again in 4 weeks.

## 2016-11-29 NOTE — Assessment & Plan Note (Addendum)
  Arthritis. We'll continue to monitor. Possible injection and PT. Encourage weight loss. Discussed otc meds. Stability brace given. Hopefully this will be beneficial. Follow-up again in 4 weeks.

## 2016-12-28 ENCOUNTER — Encounter: Payer: Self-pay | Admitting: Family Medicine

## 2016-12-28 ENCOUNTER — Ambulatory Visit (INDEPENDENT_AMBULATORY_CARE_PROVIDER_SITE_OTHER): Payer: Managed Care, Other (non HMO) | Admitting: Family Medicine

## 2016-12-28 VITALS — BP 118/80 | HR 57 | Ht 65.0 in | Wt 228.0 lb

## 2016-12-28 DIAGNOSIS — M23300 Other meniscus derangements, unspecified lateral meniscus, right knee: Secondary | ICD-10-CM

## 2016-12-28 DIAGNOSIS — M1711 Unilateral primary osteoarthritis, right knee: Secondary | ICD-10-CM

## 2016-12-28 NOTE — Assessment & Plan Note (Signed)
Stable, continue to monitor.  Continue exercises  Declined injection  RTC 6 weeks.

## 2016-12-28 NOTE — Progress Notes (Signed)
[facility] [place], [place] [address] Phone: [phone] Subjective:    I'm seeing this patient by the request  of:  Laurie Higgins  CC: Right knee pain f/u  [id number]  Laurie Higgins is a 61 y.o. female coming in with complaint of right knee pain. Patient was seen previously and did have a mild displacement of the meniscus area and patient was to do home exercise, icing regimen, topical anti-inflammatories. Patient was to avoid certain activities. Patient states 70% better at this time. Doing well overall.  Still some clicking.      Past Medical History:  Diagnosis Date  . Back pain, lumbosacral    muscular vs spinal, sees chiropractor sometimes  . Capsulitis of left foot   . Hypercholesteremia   . Hyperlipidemia   . Hypothyroid    Past Surgical History:  Procedure Laterality Date  . CESAREAN SECTION  06/08/1988  . COLONOSCOPY WITH PROPOFOL N/A 03/17/2015   Procedure: COLONOSCOPY WITH PROPOFOL;  Surgeon: Laurie Lame, MD;  Location: [facility];  Service: Endoscopy;  Laterality: N/A;  SIGMOID COLON POLYPS  X  4    . TUBAL LIGATION Bilateral 1995  . WISDOM TOOTH EXTRACTION     age 62   Social History   Social History  . Marital status: Married    Spouse name: N/A  . Number of children: N/A  . Years of education: N/A   Social History Main Topics  . Smoking status: Current Every Day Smoker    Packs/day: 1.00    Years: 40.00    Types: Cigarettes  . Smokeless tobacco: Never Used  . Alcohol use No  . Drug use: No  . Sexual activity: Not Asked   Other Topics Concern  . None   Social History Narrative  . None   Allergies  Allergen Reactions  . Lactose Intolerance (Gi) Other (See Comments)    Gas/GI distress  . Sulfa Antibiotics Hives    And swelling   Family History  Problem Relation Age of Onset  . Breast cancer Mother 66  . Bladder Cancer Father   . Hypertension Father     Past medical history,  social, surgical and family history all reviewed in electronic medical record.  No pertanent information unless stated regarding to the chief complaint.   Review of Systems: No headache, visual changes, nausea, vomiting, diarrhea, constipation, dizziness, abdominal pain, skin rash, fevers, chills, night sweats, weight loss, swollen lymph nodes, body aches, joint swelling, muscle aches, chest pain, shortness of breath, mood changes.    Objective  Blood pressure 118/80, pulse (!) 57, height 5\' 5"  (1.651 m), weight 228 lb (103.4 kg), SpO2 99 %.   Systems examined below as of 12/28/16 General: NAD A&O x3 mood, affect normal  HEENT: Pupils equal, extraocular movements intact no nystagmus Respiratory: not short of breath at rest or with speaking Cardiovascular: No lower extremity edema, non tender Skin: Warm dry intact with no signs of infection or rash on extremities or on axial skeleton. Abdomen: Soft nontender, no masses Neuro: Cranial nerves  intact, neurovascularly intact in all extremities with 2+ DTRs and 2+ pulses. Lymph: No lymphadenopathy appreciated today  Gait very mild  MSK: Non tender with full range of motion and good stability and symmetric strength and tone of shoulders, elbows, wrist,  hips and ankles bilaterally.   Knee: Right Normal to inspection with no erythema or effusion or obvious bony abnormalities. Palpation normal with no warmth, joint  line tenderness, patellar tenderness, or condyle tenderness. ROM full in flexion and extension and lower leg rotation. Ligaments with solid consistent endpoints including ACL, PCL, LCL, MCL. Positive Mcmurray's, Apley's, and Thessalonian tests. Non painful patellar compression. Patellar glide without crepitus. Patellar and quadriceps tendons unremarkable. Hamstring and quadriceps strength is normal.          Impression and Recommendations:     This case required medical decision making of moderate complexity.       Note: This dictation was prepared with Dragon dictation along with smaller phrase technology. Any transcriptional errors that result from this process are unintentional.

## 2016-12-28 NOTE — Patient Instructions (Signed)
God to see you  You are doing great  Lets check in in 6 weeks.

## 2017-01-28 ENCOUNTER — Other Ambulatory Visit: Payer: Self-pay

## 2017-01-28 DIAGNOSIS — Z299 Encounter for prophylactic measures, unspecified: Secondary | ICD-10-CM

## 2017-01-28 NOTE — Progress Notes (Signed)
Patient came in to have blood drawn for testing per Dr. Waylan Boga orders.

## 2017-01-29 LAB — LIPID PANEL
Chol/HDL Ratio: 4.8 ratio — ABNORMAL HIGH (ref 0.0–4.4)
Cholesterol, Total: 200 mg/dL — ABNORMAL HIGH (ref 100–199)
HDL: 42 mg/dL (ref >39)
LDL Calculated: 138 mg/dL — ABNORMAL HIGH (ref 0–99)
Triglycerides: 100 mg/dL (ref 0–149)
VLDL CHOLESTEROL CAL: 20 mg/dL (ref 5–40)

## 2017-01-29 LAB — VITAMIN D 25 HYDROXY (VIT D DEFICIENCY, FRACTURES): VIT D 25 HYDROXY: 19.3 ng/mL — AB (ref 30.0–100.0)

## 2017-01-29 LAB — ALT: ALT: 25 IU/L (ref 0–32)

## 2017-01-29 LAB — AST: AST: 30 IU/L (ref 0–40)

## 2017-02-09 ENCOUNTER — Encounter: Payer: Self-pay | Admitting: Family Medicine

## 2017-02-09 ENCOUNTER — Ambulatory Visit (INDEPENDENT_AMBULATORY_CARE_PROVIDER_SITE_OTHER): Payer: Managed Care, Other (non HMO) | Admitting: Family Medicine

## 2017-02-09 DIAGNOSIS — M23300 Other meniscus derangements, unspecified lateral meniscus, right knee: Secondary | ICD-10-CM | POA: Diagnosis not present

## 2017-02-09 DIAGNOSIS — M1711 Unilateral primary osteoarthritis, right knee: Secondary | ICD-10-CM | POA: Diagnosis not present

## 2017-02-09 NOTE — Assessment & Plan Note (Signed)
Stable. We'll continue conservative therapy.

## 2017-02-09 NOTE — Assessment & Plan Note (Signed)
Doing well with conservative therapy. We will make no significant changes and treatment. Patient will follow-up on an as-needed basis. Has topical anti-inflammatories if needed. Any locking or giving out to come back sooner.

## 2017-02-09 NOTE — Progress Notes (Signed)
[name] [place],  [address] Phone: [phone] Subjective:    I'm seeing this patient by the request  of:  Ashok Cordia  CC: Right knee pain f/u  MCN:[id number]  Laurie Higgins is a 61 y.o. female coming in with complaint of right knee pain. Patient was seen previously and did have a mild displacement of the meniscus area and patient was to do home exercise, icing regimen, topical anti-inflammatories. Patient was to avoid certain activities. Patient states now 90% better. Not having any significant pain at all. Increasing activity including multiple times increasing activity with very minimal pain.      Past Medical History:  Diagnosis Date  . Back pain, lumbosacral    muscular vs spinal, sees chiropractor sometimes  . Capsulitis of left foot   . Hypercholesteremia   . Hyperlipidemia   . Hypothyroid    Past Surgical History:  Procedure Laterality Date  . CESAREAN SECTION  06/08/1988  . COLONOSCOPY WITH PROPOFOL N/A 03/17/2015   Procedure: COLONOSCOPY WITH PROPOFOL;  Surgeon: Lucilla Lame, MD;  Location: [facility];  Service: Endoscopy;  Laterality: N/A;  SIGMOID COLON POLYPS  X  4    . TUBAL LIGATION Bilateral 1995  . WISDOM TOOTH EXTRACTION     age 26   Social History   Social History  . Marital status: Married    Spouse name: N/A  . Number of children: N/A  . Years of education: N/A   Social History Main Topics  . Smoking status: Current Every Day Smoker    Packs/day: 1.00    Years: 40.00    Types: Cigarettes  . Smokeless tobacco: Never Used  . Alcohol use No  . Drug use: No  . Sexual activity: Not Asked   Other Topics Concern  . None   Social History Narrative  . None   Allergies  Allergen Reactions  . Lactose Intolerance (Gi) Other (See Comments)    Gas/GI distress  . Sulfa Antibiotics Hives    And swelling   Family History  Problem Relation Age of Onset  . Breast cancer Mother 73  .  Bladder Cancer Father   . Hypertension Father     Past medical history, social, surgical and family history all reviewed in electronic medical record.  No pertanent information unless stated regarding to the chief complaint.   Review of Systems: No headache, visual changes, nausea, vomiting, diarrhea, constipation, dizziness, abdominal pain, skin rash, fevers, chills, night sweats, weight loss, swollen lymph nodes, body aches, joint swelling, muscle aches, chest pain, shortness of breath, mood changes.   Objective  Blood pressure 118/80, pulse 70, height 5' 5.5" (1.664 m), weight 220 lb (99.8 kg).   Systems examined below as of 02/09/17 General: NAD A&O x3 mood, affect normal  HEENT: Pupils equal, extraocular movements intact no nystagmus Respiratory: not short of breath at rest or with speaking Cardiovascular: No lower extremity edema, non tender Skin: Warm dry intact with no signs of infection or rash on extremities or on axial skeleton. Abdomen: Soft nontender, no masses Neuro: Cranial nerves  intact, neurovascularly intact in all extremities with 2+ DTRs and 2+ pulses. Lymph: No lymphadenopathy appreciated today  Gait normal with good balance and coordination.  MSK: Non tender with full range of motion and good stability and symmetric strength and tone of shoulders, elbows, wrist, hips and ankles bilaterally.     Knee: Right Normal to inspection with no erythema or effusion or  obvious bony abnormalities. Palpation normal with no warmth, joint line tenderness, patellar tenderness, or condyle tenderness. ROM full in flexion and extension and lower leg rotation. Ligaments with solid consistent endpoints including ACL, PCL, LCL, MCL. Negative Mcmurray's, Apley's, and Thessalonian tests. Non painful patellar compression. Patellar glide without crepitus. Patellar and quadriceps tendons unremarkable. Hamstring and quadriceps strength is normal.   Contralateral knee  unremarkable       Impression and Recommendations:     This case required medical decision making of moderate complexity.      Note: This dictation was prepared with Dragon dictation along with smaller phrase technology. Any transcriptional errors that result from this process are unintentional.

## 2017-05-26 ENCOUNTER — Other Ambulatory Visit: Payer: Self-pay

## 2017-05-26 DIAGNOSIS — Z299 Encounter for prophylactic measures, unspecified: Secondary | ICD-10-CM

## 2017-05-26 NOTE — Progress Notes (Signed)
Patient came in to have blood drawn for testing per Dr. Romie Minus Dostou's orders.

## 2017-05-27 LAB — TSH+FREE T4
Free T4: 1.36 ng/dL (ref 0.82–1.77)
TSH: 4.86 u[IU]/mL — ABNORMAL HIGH (ref 0.450–4.500)

## 2017-05-27 LAB — VITAMIN D 25 HYDROXY (VIT D DEFICIENCY, FRACTURES): VIT D 25 HYDROXY: 16.6 ng/mL — AB (ref 30.0–100.0)

## 2017-09-08 ENCOUNTER — Other Ambulatory Visit: Payer: Self-pay

## 2017-09-08 NOTE — Progress Notes (Signed)
Unable to collect blood, Coming back 09/12/17.

## 2017-09-12 ENCOUNTER — Other Ambulatory Visit: Payer: Self-pay

## 2018-03-07 DIAGNOSIS — I2699 Other pulmonary embolism without acute cor pulmonale: Secondary | ICD-10-CM

## 2018-03-07 HISTORY — DX: Other pulmonary embolism without acute cor pulmonale: I26.99

## 2018-08-09 ENCOUNTER — Other Ambulatory Visit: Payer: Self-pay

## 2018-08-09 ENCOUNTER — Encounter: Payer: Self-pay | Admitting: *Deleted

## 2018-08-09 ENCOUNTER — Ambulatory Visit
Admission: EM | Admit: 2018-08-09 | Discharge: 2018-08-09 | Disposition: A | Discharge status: Home / Self Care | Payer: Managed Care, Other (non HMO) | Attending: Family Medicine | Admitting: Family Medicine

## 2018-08-09 DIAGNOSIS — Z87891 Personal history of nicotine dependence: Secondary | ICD-10-CM | POA: Diagnosis not present

## 2018-08-09 DIAGNOSIS — Z20828 Contact with and (suspected) exposure to other viral communicable diseases: Secondary | ICD-10-CM | POA: Diagnosis not present

## 2018-08-09 DIAGNOSIS — Z853 Personal history of malignant neoplasm of breast: Secondary | ICD-10-CM | POA: Diagnosis not present

## 2018-08-09 DIAGNOSIS — Z9221 Personal history of antineoplastic chemotherapy: Secondary | ICD-10-CM

## 2018-08-09 HISTORY — DX: Malignant (primary) neoplasm, unspecified: C80.1

## 2018-08-09 MED ORDER — OSELTAMIVIR PHOSPHATE 75 MG PO CAPS: 75.0000 mg | ORAL_CAPSULE | Freq: Every day | ORAL | 0 refills | Status: DC

## 2018-08-09 NOTE — ED Provider Notes (Signed)
[facility]    CSN: [id number] Arrival date & time: 08/09/18  1927  History   Chief Complaint Chief Complaint  Patient presents with  . Follow-up   HPI  63 year old female with current breast cancer who is recently status post mastectomy presents with exposure to influenza.  Reports her husband appears to have influenza.  Symptoms started abruptly today.  Patient reports that she feels well and is asymptomatic currently.  Patient desires prophylaxis given ongoing cancer treatment and recent mastectomy.  Currently patient feels well.  No fever.  No reports of respiratory symptoms at this time.  No other complaints.  PMH, Surgical Hx, Family Hx, Social History reviewed and updated as below.  Past Medical History:  Diagnosis Date  . Back pain, lumbosacral    muscular vs spinal, sees chiropractor sometimes  . Cancer ([place])   . Capsulitis of left foot   . Hypercholesteremia   . Hyperlipidemia   . Hypothyroid     Patient Active Problem List   Diagnosis Date Noted  . 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 Degenerative arthritis of right knee 11/29/2016  . Obesity (BMI 30-39.9) 06/15/2016  . Special screening for malignant neoplasms, colon   . Benign neoplasm of sigmoid colon   . Hypothyroidism 03/06/2015  . Pure hypercholesterolemia 03/06/2015  . Breast mass seen on mammogram 03/06/2015  . 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 CESAREAN SECTION  06/08/1988  . COLONOSCOPY WITH PROPOFOL N/A 03/17/2015   Procedure: COLONOSCOPY WITH PROPOFOL;  Surgeon: Lucilla Lame, MD;  Location: [facility];  Service: Endoscopy;  Laterality: N/A;  SIGMOID COLON POLYPS  X  4    . MASTECTOMY    . TUBAL LIGATION Bilateral 1995  . WISDOM TOOTH EXTRACTION     age 33    OB History   No obstetric history on file.      Home Medications    Prior to Admission medications   Medication Sig  Start Date End Date Taking? Authorizing Provider  docusate sodium (COLACE) 100 MG capsule Take 100 mg by mouth 2 (two) times daily.   Yes [provider]  levothyroxine (SYNTHROID, LEVOTHROID) 100 MCG tablet Take 1 tablet (100 mcg total) by mouth daily. 06/09/16  Yes Fisher, Linden Dolin, PA-C  LORazepam (ATIVAN) 1 MG tablet Take 1 mg by mouth every 8 (eight) hours.   Yes [provider]  Diclofenac Sodium (PENNSAID) 2 % SOLN Place 2 application onto the skin 2 (two) times daily. 11/29/16   Lyndal Pulley, DO  oseltamivir (TAMIFLU) 75 MG capsule Take 1 capsule (75 mg total) by mouth daily. 08/09/18   Coral Spikes, DO    Family History Family History  Problem Relation Age of Onset  . Breast cancer Mother 34  . Bladder Cancer Father   . Hypertension Father     Social History Social History   Tobacco Use  . Smoking status: Former Smoker    Packs/day: 1.00    Years: 40.00    Pack years: 40.00    Types: Cigarettes  . Smokeless tobacco: Never Used  Substance Use Topics  . Alcohol use: No    Alcohol/week: 0.0 standard drinks  . Drug use: No     Allergies   Lactose intolerance (gi); Sulfa antibiotics; and Tylenol [acetaminophen]   Review of Systems Review of Systems  Constitutional: Negative.   HENT: Negative.   Respiratory: Negative.  Physical Exam Triage Vital Signs ED Triage Vitals  Enc Vitals Group     BP 08/09/18 1954 107/75     Pulse Rate 08/09/18 1954 78     Resp 08/09/18 1954 18     Temp 08/09/18 1954 98.2 F (36.8 C)     Temp Source 08/09/18 1954 Oral     SpO2 08/09/18 1954 100 %     Weight --      Height --      Head Circumference --      Peak Flow --      Pain Score 08/09/18 1950 0     Pain Loc --      Pain Edu? --      Excl. in [place]? --    Updated Vital Signs BP 107/75 (BP Location: Right Arm)   Pulse 78   Temp 98.2 F (36.8 C) (Oral)   Resp 18   SpO2 100%   Visual Acuity Right Eye Distance:   Left Eye Distance:   Bilateral  Distance:    Right Eye Near:   Left Eye Near:    Bilateral Near:     Physical Exam Constitutional:      General: She is not in acute distress.    Appearance: Normal appearance.  HENT:     Head: Normocephalic and atraumatic.     Nose: Nose normal.     Mouth/Throat:     Pharynx: Oropharynx is clear.  Eyes:     General:        Right eye: No discharge.        Left eye: No discharge.     Conjunctiva/sclera: Conjunctivae normal.  Cardiovascular:     Rate and Rhythm: Normal rate and regular rhythm.  Pulmonary:     Effort: Pulmonary effort is normal.     Breath sounds: Normal breath sounds.  Neurological:     Mental Status: She is alert.  Psychiatric:        Mood and Affect: Mood normal.        Behavior: Behavior normal.    UC Treatments / Results  Labs (all labs ordered are listed, but only abnormal results are displayed) Labs Reviewed - No data to display  EKG None  Radiology No results found.  Procedures Procedures (including critical care time)  Medications Ordered in UC Medications - No data to display  Initial Impression / Assessment and Plan / UC Course  I have reviewed the triage vital signs and the nursing notes.  Pertinent labs & imaging results that were available during my care of the patient were reviewed by me and considered in my medical decision making (see chart for details).    63 year old female presents with exposure to influenza.  Placing patient on prophylaxis given current cancer treatment and immunocompromised status.  Tamiflu sent.  Final Clinical Impressions(s) / UC Diagnoses   Final diagnoses:  Exposure to influenza     Discharge Instructions     Medication as prescribed.  Call if you develop symptoms.  Take care  Dr. Lacinda Axon    ED Prescriptions    Medication Sig Dispense Auth. Provider   oseltamivir (TAMIFLU) 75 MG capsule Take 1 capsule (75 mg total) by mouth daily. 10 capsule Coral Spikes, DO     Controlled Substance  Prescriptions [place] Controlled Substance Registry consulted? Not Applicable   Coral Spikes, DO 08/09/18 2112

## 2018-08-09 NOTE — Discharge Instructions (Signed)
Medication as prescribed.  Call if you develop symptoms.  Take care  Dr. Lacinda Axon

## 2018-08-09 NOTE — ED Triage Notes (Signed)
Patient states that she has been exposed to the flu, states that she feels fine. States that husband has symptoms of the flu.   Patient finished chemo in Jan and had mastectomy 2 weeks ago.

## 2019-09-27 ENCOUNTER — Other Ambulatory Visit: Payer: Self-pay | Admitting: Orthopedic Surgery

## 2019-09-27 DIAGNOSIS — M25561 Pain in right knee: Secondary | ICD-10-CM

## 2019-10-01 ENCOUNTER — Ambulatory Visit
Admission: RE | Admit: 2019-10-01 | Discharge: 2019-10-01 | Disposition: A | Discharge status: Home / Self Care | Payer: Managed Care, Other (non HMO) | Source: Ambulatory Visit | Attending: Orthopedic Surgery | Admitting: Orthopedic Surgery

## 2019-10-01 ENCOUNTER — Other Ambulatory Visit: Payer: Self-pay

## 2019-10-01 DIAGNOSIS — M25561 Pain in right knee: Secondary | ICD-10-CM | POA: Insufficient documentation

## 2019-10-11 ENCOUNTER — Encounter: Payer: Self-pay | Admitting: Orthopedic Surgery

## 2019-10-15 ENCOUNTER — Other Ambulatory Visit: Payer: Self-pay | Admitting: Orthopedic Surgery

## 2019-10-17 ENCOUNTER — Other Ambulatory Visit
Admission: RE | Admit: 2019-10-17 | Discharge: 2019-10-17 | Disposition: A | Discharge status: Home / Self Care | Payer: Managed Care, Other (non HMO) | Source: Ambulatory Visit | Attending: Orthopedic Surgery | Admitting: Orthopedic Surgery

## 2019-10-17 ENCOUNTER — Other Ambulatory Visit: Payer: Self-pay | Admitting: Orthopedic Surgery

## 2019-10-17 DIAGNOSIS — Z20822 Contact with and (suspected) exposure to covid-19: Secondary | ICD-10-CM | POA: Insufficient documentation

## 2019-10-17 DIAGNOSIS — Z01812 Encounter for preprocedural laboratory examination: Secondary | ICD-10-CM | POA: Insufficient documentation

## 2019-10-17 LAB — SARS CORONAVIRUS 2 (TAT 6-24 HRS): SARS Coronavirus 2: NEGATIVE

## 2019-10-17 NOTE — Addendum Note (Signed)
Addended byLeim Fabry on: 10/17/2019 09:23 AM   Modules accepted: Orders

## 2019-10-18 NOTE — Anesthesia Preprocedure Evaluation (Addendum)
Anesthesia Evaluation  Patient identified by MRN, date of birth, ID band Patient awake    Reviewed: Allergy & Precautions, NPO status , Patient's Chart, lab work & pertinent test results  History of Anesthesia Complications Negative for: history of anesthetic complications  Airway Mallampati: II   Neck ROM: Full    Dental no notable dental hx.    Pulmonary former smoker (quit 2019),    Pulmonary exam normal breath sounds clear to auscultation       Cardiovascular Exercise Tolerance: Good Normal cardiovascular exam Rhythm:Regular Rate:Normal  Echo 08/22/19:   NORMAL LEFT VENTRICULAR SYSTOLIC FUNCTION  NORMAL LA PRESSURES WITH NORMAL DIASTOLIC FUNCTION  NORMAL RIGHT VENTRICULAR SYSTOLIC FUNCTION  VALVULAR REGURGITATION: TRIVIAL MR, TRIVIAL PR, TRIVIAL TR  NO VALVULAR STENOSIS   Compared with prior Echo study on 04/05/2018: RIGHT ATRIAL THROMBUS NO  LONGER SEEN. MILDLY THICKENED AORTIC VALVE. DECREASED LONGITUDINAL STRAIN  WITH PRESERVED LVEF.   Neuro/Psych  Neuromuscular disease (peripheral neuropathy)    GI/Hepatic negative GI ROS,   Endo/Other  Hypothyroidism   Renal/GU negative Renal ROS     Musculoskeletal  (+) Arthritis ,   Abdominal   Peds  Hematology Hx PE associated with port Breast cancer   Anesthesia Other Findings   Reproductive/Obstetrics                            Anesthesia Physical Anesthesia Plan  ASA: II  Anesthesia Plan: General   Post-op Pain Management:    Induction: Intravenous  PONV Risk Score and Plan: 2 and Ondansetron, Dexamethasone and Treatment may vary due to age or medical condition  Airway Management Planned: LMA  Additional Equipment:   Intra-op Plan:   Post-operative Plan: Extubation in OR  Informed Consent: I have reviewed the patients History and Physical, chart, labs and discussed the procedure including the risks, benefits and  alternatives for the proposed anesthesia with the patient or authorized representative who has indicated his/her understanding and acceptance.       Plan Discussed with: CRNA  Anesthesia Plan Comments:        Anesthesia Quick Evaluation

## 2019-10-19 ENCOUNTER — Ambulatory Visit: Payer: Managed Care, Other (non HMO) | Admitting: Anesthesiology

## 2019-10-19 ENCOUNTER — Encounter: Payer: Self-pay | Admitting: Orthopedic Surgery

## 2019-10-19 ENCOUNTER — Ambulatory Visit: Payer: Managed Care, Other (non HMO)

## 2019-10-19 ENCOUNTER — Ambulatory Visit
Admission: RE | Admit: 2019-10-19 | Discharge: 2019-10-19 | Disposition: A | Discharge status: Home / Self Care | Payer: Managed Care, Other (non HMO) | Attending: Orthopedic Surgery | Admitting: Orthopedic Surgery

## 2019-10-19 ENCOUNTER — Encounter
Admission: RE | Disposition: A | Discharge status: Home / Self Care | Payer: Self-pay | Source: Home / Self Care | Attending: Orthopedic Surgery

## 2019-10-19 DIAGNOSIS — X58XXXA Exposure to other specified factors, initial encounter: Secondary | ICD-10-CM | POA: Diagnosis not present

## 2019-10-19 DIAGNOSIS — Z853 Personal history of malignant neoplasm of breast: Secondary | ICD-10-CM | POA: Insufficient documentation

## 2019-10-19 DIAGNOSIS — E039 Hypothyroidism, unspecified: Secondary | ICD-10-CM | POA: Diagnosis not present

## 2019-10-19 DIAGNOSIS — Z86711 Personal history of pulmonary embolism: Secondary | ICD-10-CM | POA: Diagnosis not present

## 2019-10-19 DIAGNOSIS — Z79890 Hormone replacement therapy (postmenopausal): Secondary | ICD-10-CM | POA: Diagnosis not present

## 2019-10-19 DIAGNOSIS — S83282A Other tear of lateral meniscus, current injury, left knee, initial encounter: Secondary | ICD-10-CM | POA: Insufficient documentation

## 2019-10-19 DIAGNOSIS — Z9012 Acquired absence of left breast and nipple: Secondary | ICD-10-CM | POA: Diagnosis not present

## 2019-10-19 DIAGNOSIS — Z79899 Other long term (current) drug therapy: Secondary | ICD-10-CM | POA: Insufficient documentation

## 2019-10-19 DIAGNOSIS — E785 Hyperlipidemia, unspecified: Secondary | ICD-10-CM | POA: Insufficient documentation

## 2019-10-19 DIAGNOSIS — M84462A Pathological fracture, left tibia, initial encounter for fracture: Secondary | ICD-10-CM | POA: Insufficient documentation

## 2019-10-19 DIAGNOSIS — Z9221 Personal history of antineoplastic chemotherapy: Secondary | ICD-10-CM | POA: Diagnosis not present

## 2019-10-19 HISTORY — PX: KNEE ARTHROSCOPY: SHX127

## 2019-10-19 HISTORY — DX: Polyneuropathy, unspecified: G62.9

## 2019-10-19 SURGERY — ARTHROSCOPY, KNEE
Anesthesia: General | Site: Knee | Laterality: Right

## 2019-10-19 MED ORDER — CEFAZOLIN SODIUM-DEXTROSE 2-4 GM/100ML-% IV SOLN
2.0000 g | INTRAVENOUS | Status: AC
  Administered 2019-10-19: 2 g via INTRAVENOUS

## 2019-10-19 MED ORDER — ACETAMINOPHEN 10 MG/ML IV SOLN: 1000.0000 mg | Freq: Once | INTRAVENOUS | Status: DC | PRN

## 2019-10-19 MED ORDER — IBUPROFEN 800 MG PO TABS
800.0000 mg | ORAL_TABLET | Freq: Three times a day (TID) | ORAL | 1 refills | Status: AC
Start: 2019-10-19 — End: 2019-11-02

## 2019-10-19 MED ORDER — FENTANYL CITRATE (PF) 100 MCG/2ML IJ SOLN
INTRAMUSCULAR | Status: DC | PRN
  Administered 2019-10-19: 50 ug via INTRAVENOUS
  Administered 2019-10-19 (×2): 25 ug via INTRAVENOUS

## 2019-10-19 MED ORDER — ACETAMINOPHEN 500 MG PO TABS: 1000.0000 mg | ORAL_TABLET | Freq: Three times a day (TID) | ORAL | 2 refills | Status: DC

## 2019-10-19 MED ORDER — LIDOCAINE HCL (CARDIAC) PF 100 MG/5ML IV SOSY
PREFILLED_SYRINGE | INTRAVENOUS | Status: DC | PRN
  Administered 2019-10-19: 30 mg via INTRATRACHEAL

## 2019-10-19 MED ORDER — FENTANYL CITRATE (PF) 100 MCG/2ML IJ SOLN
25.0000 ug | INTRAMUSCULAR | Status: DC | PRN
  Administered 2019-10-19: 25 ug via INTRAVENOUS

## 2019-10-19 MED ORDER — DEXAMETHASONE SODIUM PHOSPHATE 4 MG/ML IJ SOLN
INTRAMUSCULAR | Status: DC | PRN
  Administered 2019-10-19: 4 mg via INTRAVENOUS

## 2019-10-19 MED ORDER — MIDAZOLAM HCL 5 MG/5ML IJ SOLN
INTRAMUSCULAR | Status: DC | PRN
  Administered 2019-10-19 (×2): 1 mg via INTRAVENOUS

## 2019-10-19 MED ORDER — OXYCODONE HCL 5 MG PO TABS: 5.0000 mg | ORAL_TABLET | Freq: Once | ORAL | Status: DC | PRN

## 2019-10-19 MED ORDER — ONDANSETRON HCL 4 MG/2ML IJ SOLN: 4.0000 mg | Freq: Once | INTRAMUSCULAR | Status: DC | PRN

## 2019-10-19 MED ORDER — PROPOFOL 10 MG/ML IV BOLUS
INTRAVENOUS | Status: DC | PRN
  Administered 2019-10-19: 150 mg via INTRAVENOUS

## 2019-10-19 MED ORDER — ONDANSETRON 4 MG PO TBDP: 4.0000 mg | ORAL_TABLET | Freq: Three times a day (TID) | ORAL | 0 refills | Status: AC | PRN

## 2019-10-19 MED ORDER — ONDANSETRON HCL 4 MG/2ML IJ SOLN
INTRAMUSCULAR | Status: DC | PRN
  Administered 2019-10-19: 4 mg via INTRAVENOUS

## 2019-10-19 MED ORDER — OXYCODONE HCL 5 MG/5ML PO SOLN: 5.0000 mg | Freq: Once | ORAL | Status: DC | PRN

## 2019-10-19 MED ORDER — TRAMADOL HCL 50 MG PO TABS
50.0000 mg | ORAL_TABLET | Freq: Once | ORAL | Status: AC
  Administered 2019-10-19: 50 mg via ORAL

## 2019-10-19 MED ORDER — LACTATED RINGERS IV SOLN: INTRAVENOUS | Status: DC

## 2019-10-19 MED ORDER — ASPIRIN EC 325 MG PO TBEC
325.0000 mg | DELAYED_RELEASE_TABLET | Freq: Every day | ORAL | 0 refills | Status: AC
Start: 2019-10-19 — End: 2019-11-02

## 2019-10-19 MED ORDER — TRAMADOL HCL 50 MG PO TABS: 100.0000 mg | ORAL_TABLET | Freq: Three times a day (TID) | ORAL | 0 refills | Status: AC | PRN

## 2019-10-19 MED ORDER — OXYCODONE HCL 5 MG PO TABS: 5.0000 mg | ORAL_TABLET | ORAL | 0 refills | Status: AC | PRN

## 2019-10-19 MED ORDER — LIDOCAINE-EPINEPHRINE 1 %-1:100000 IJ SOLN
INTRAMUSCULAR | Status: DC | PRN
  Administered 2019-10-19: 3 mL

## 2019-10-19 SURGICAL SUPPLY — 43 items
ADAPTER IRRIG TUBE 2 SPIKE SOL (ADAPTER) ×6 IMPLANT
BLADE SURG SZ11 CARB STEEL (BLADE) ×3 IMPLANT
BNDG COHESIVE 4X5 TAN STRL (GAUZE/BANDAGES/DRESSINGS) ×3 IMPLANT
BNDG ESMARK 6X12 TAN STRL LF (GAUZE/BANDAGES/DRESSINGS) ×3 IMPLANT
BUR RADIUS 3.5 (BURR) ×3 IMPLANT
BUR RADIUS 4.0X18.5 (BURR) IMPLANT
CHLORAPREP W/TINT 26 (MISCELLANEOUS) ×3 IMPLANT
COOLER POLAR GLACIER W/PUMP (MISCELLANEOUS) ×3 IMPLANT
COVER LIGHT HANDLE UNIVERSAL (MISCELLANEOUS) ×6 IMPLANT
CUFF TOURN SGL QUICK 30 (TOURNIQUET CUFF) ×2
CUFF TRNQT CYL 30X4X21-28X (TOURNIQUET CUFF) IMPLANT
DECANTER SPIKE VIAL GLASS SM (MISCELLANEOUS) ×3 IMPLANT
DRAPE C-ARM XRAY 36X54 (DRAPES) ×2 IMPLANT
DRAPE C-ARMOR (DRAPES) ×2 IMPLANT
DRAPE IMP U-DRAPE 54X76 (DRAPES) ×3 IMPLANT
GAUZE SPONGE 4X4 12PLY STRL (GAUZE/BANDAGES/DRESSINGS) ×3 IMPLANT
GLOVE BIO SURGEON STRL SZ7.5 (GLOVE) ×3 IMPLANT
GLOVE BIOGEL PI IND STRL 8 (GLOVE) ×1 IMPLANT
GLOVE BIOGEL PI INDICATOR 8 (GLOVE) ×2
GOWN STRL REIN 2XL XLG LVL4 (GOWN DISPOSABLE) ×3 IMPLANT
GOWN STRL REUS W/TWL LRG LVL3 (GOWN DISPOSABLE) ×3 IMPLANT
GRAFT FILLER BONE 5ML (Knees) IMPLANT
IV LACTATED RINGER IRRG 3000ML (IV SOLUTION) ×8
IV LR IRRIG 3000ML ARTHROMATIC (IV SOLUTION) ×4 IMPLANT
KIT ACCUFILL 5CC (Knees) ×1 IMPLANT
KIT KNEE SCP 414.502 (Knees) ×3 IMPLANT
KIT TURNOVER KIT A (KITS) ×3 IMPLANT
MANIFOLD NEPTUNE II (INSTRUMENTS) ×3 IMPLANT
MAT ABSORB  FLUID 56X50 GRAY (MISCELLANEOUS) ×4
MAT ABSORB FLUID 56X50 GRAY (MISCELLANEOUS) ×1 IMPLANT
PACK ARTHROSCOPY KNEE (MISCELLANEOUS) ×3 IMPLANT
PAD ABD DERMACEA PRESS 5X9 (GAUZE/BANDAGES/DRESSINGS) ×6 IMPLANT
PAD WRAPON POLAR KNEE (MISCELLANEOUS) ×1 IMPLANT
PADDING CAST BLEND 6X4 STRL (MISCELLANEOUS) ×1 IMPLANT
PADDING STRL CAST 6IN (MISCELLANEOUS) ×2
SET TUBE SUCT SHAVER OUTFL 24K (TUBING) ×3 IMPLANT
SET TUBE TIP INTRA-ARTICULAR (MISCELLANEOUS) ×3 IMPLANT
SUT ETHILON 3-0 FS-10 30 BLK (SUTURE) ×3
SUTURE EHLN 3-0 FS-10 30 BLK (SUTURE) ×1 IMPLANT
TOWEL OR 17X26 4PK STRL BLUE (TOWEL DISPOSABLE) ×6 IMPLANT
TUBING ARTHRO INFLOW-ONLY STRL (TUBING) ×3 IMPLANT
WAND WEREWOLF FLOW 90D (MISCELLANEOUS) ×3 IMPLANT
WRAPON POLAR PAD KNEE (MISCELLANEOUS) ×3

## 2019-10-19 NOTE — H&P (Signed)
Paper H&P to be scanned into permanent record. H&P reviewed. No significant changes noted.  

## 2019-10-19 NOTE — Op Note (Signed)
Operative Note    SURGERY DATE: 10/19/2019   PRE-OP DIAGNOSIS:  1.  Left subchondral insufficiency fracture of lateral tibial plateau 2.  Left lateral meniscus tear 3.  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  Left subchondral insufficiency fracture of lateral tibial plateau 2.  Left lateral meniscus tear 3.  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 Left knee arthroscopically assisted subchondroplasty of lateral tibial plateau (treatment of lateral tibial plateau fracture) 2. Left knee arthroscopy, partial lateral meniscectomy 3. Left knee chondroplasty of medial and and lateral compartments   SURGEON: Cato Mulligan, MD   ANESTHESIA: Regional + Gen   ESTIMATED BLOOD LOSS: minimal   TOTAL IV FLUIDS: per anesthesia   INDICATION(S):  Laurie Higgins is a 64 y.o. female with signs and symptoms as well as MRI finding of severe bone marrow edema and subchondral insufficiency fractures of the lateral tibial plateau as well as a significantly displaced lateral meniscus tear.  The patient underwent a trial of nonsurgical management for approximately 3 months in the form of activity modifications, medications, and bracing without improvement in symptoms.  After discussion of risks, benefits, and alternatives to surgery, the patient elected to proceed.    OPERATIVE FINDINGS:    Examination under anesthesia: A careful examination under anesthesia was performed.  Passive range of motion was: Hyperextension: 1.  Extension: 0.  Flexion: 120.  Lachman: normal. Pivot Shift: normal.  Posterior drawer: normal.  Varus stability in full extension: normal.  Varus stability in 30 degrees of flexion: normal.  Valgus stability in full extension: normal.  Valgus stability in 30 degrees of flexion: normal.   Intra-operative findings: A thorough arthroscopic examination of the  knee was performed.  The findings are: 1. Suprapatellar pouch: Normal 2. Undersurface of median ridge: Grade 2-3 degenerative changes 3. Medial patellar facet: Grade 2 degenerative changes 4. Lateral patellar facet: Grade 2 degenerative changes 5. Trochlea: normal 6. Lateral gutter/popliteus tendon: Displacement of the lateral meniscus into the lateral gutter 7. Hoffa's fat pad: Inflamed 8. Medial gutter/plica: Normal 9. ACL: Normal 10. PCL: Normal 11. Medial meniscus: Normal 12. Medial compartment cartilage: Focal area on the medial femoral condyle measuring approximately 15x6 mm of grade 3-4 degenerative changes.  Normal medial tibial plateau cartilage 13. Lateral meniscus: Extensive tearing and displacement of the lateral meniscus body and anterior horn with slight extension to the posterior horn/body junction.  Primary tear pattern was a radial tear of the body affecting approximately 80% of the meniscus width.  Secondary tear pattern was a horizontal tear of the anterior horn and posterior horn/body junction 14. Lateral compartment cartilage:  Extensive grade 4 degenerative changes to the lateral tibial plateau centrally and posteriorly.  Peripheral tibial plateau with intact cartilage circumferentially.  Focal area of grade 3-4 change to the lateral femoral condyle measuring approximately 15 x 15 mm   OPERATIVE REPORT:     I identified Laurie Higgins in the pre-operative holding area. I marked the operative knee with my initials. I reviewed the risks and benefits of the proposed surgical intervention and the patient (and/or patient's guardian) wished to proceed.  The patient underwent regional anesthesia in the preoperative holding area.  The patient was transferred to the operative suite and placed in the supine position with all bony prominences padded.  Anesthesia was administered. Appropriate IV antibiotics were administered prior to incision. The extremity was then prepped and  draped  in standard fashion. A time out was performed confirming the correct extremity, correct patient, and correct procedure.   Arthroscopy portals were marked. Local anesthetic was injected to the planned portal sites. The anterolateral portal was established with an 11 blade. The arthroscope was placed in the anterolateral portal and then into the suprapatellar pouch.  A diagnostic knee scope was completed with the above findings. The medial meniscus tear was identified.   Next the medial portal was established under needle localization. The lateral meniscus tear was debrided using an arthroscopic biter and an oscillating shaver until the meniscus had stable borders.  A 70 degree arthroscope was used for better visualization.  A chondroplasty was performed of the medial and lateral compartments such that there were stable cartilage edges without any loose fragments of cartilage.  The arthroscope was withdrawn from the joint.   Using fluoroscopic guidance and correlation with the patient's MRI, a small stab incision was made over the lateral tibial plateau.  A trocar with cannula was drilled into the site of the subchondral insufficiency fracture of the lateral tibial plateau such that all of the flutes were within bone.  Calcium phosphate mixture was appropriately mixed and 5cc of calcium phosphate were injected through the cannula. Appropriate filling was seen on fluoroscopy.  The arthroscope was placed back into the joint, and there was no extravasation of calcium phosphate material within the joint.  Cannula was removed after appropriate setting.  Local anesthetic was injected about the lateral tibial plateau injection site and into the knee joint itself.   The incisions were closed with 3-0 Nylon suture. Sterile dressings included Xeroform, 4x4s, Sof-Rol, and Bias wrap. A Polarcare was placed.  The patient was then awakened and taken to the PACU hemodynamically stable without complication.      POSTOPERATIVE PLAN: The patient will be discharged home today once they meet PACU criteria. Aspirin 325 mg daily was prescribed for 2 weeks for DVT prophylaxis.  Physical therapy will start on POD#3-4. Weight-bearing as tolerated. Follow up in 2 weeks per protocol.

## 2019-10-19 NOTE — Transfer of Care (Signed)
Immediate Anesthesia Transfer of Care Note  Patient: Laurie Higgins  Procedure(s) Performed: right knee arthroscopic partial lateral meniscectomy and subchondroplasty of the lateral tibial plateau (Right Knee)  Patient Location: PACU  Anesthesia Type: General  Level of Consciousness: awake, alert  and patient cooperative  Airway and Oxygen Therapy: Patient Spontanous Breathing and Patient connected to supplemental oxygen  Post-op Assessment: Post-op Vital signs reviewed, Patient's Cardiovascular Status Stable, Respiratory Function Stable, Patent Airway and No signs of Nausea or vomiting  Post-op Vital Signs: Reviewed and stable  Complications: No apparent anesthesia complications

## 2019-10-19 NOTE — Discharge Instructions (Signed)
Arthroscopic Knee Surgery - Partial Meniscectomy   Post-Op Instructions   1. Bracing or crutches: Crutches will be provided at the time of discharge from the surgery center if you do not already have them.   2. Ice: You may be provided with a device [facility]) that allows you to ice the affected area effectively. Otherwise you can ice manually.    3. Driving:  Plan on not driving for at least 1-2 weeks. Please note that you are advised NOT to drive while taking narcotic pain medications as you may be impaired and unsafe to drive.   4. Activity: Ankle pumps several times an hour while awake to prevent blood clots. Weight bearing: as tolerated. Use crutches/walker for as needed (usually ~1 week or less) until pain allows you to ambulate without a limp. Bending and straightening the knee is unlimited. Elevate knee above heart level as much as possible for one week. Avoid standing more than 5 minutes (consecutively) for the first week.  Avoid long distance travel for 2 weeks.  5. Medications:  - You have been provided a prescription (oxycodone) for narcotic pain medicine. After surgery, take 1-2 narcotic tablets every 4 hours if needed for severe pain.  You may take Benadryl as long as you are not too sleepy to prevent allergic reaction such as hives.  If the side effects from the oxycodone are too significant or this medication is too strong, there is also a prescription for tramadol.  He can take 100 mg every 8 hours or 50 mg every 4 hours as needed for pain. - You may take up to 3000mg /day of tylenol (acetaminophen). You can take 1000mg  3x/day. This is considered the maximum safe dose for your liver. - A prescription for anti-nausea medication will be provided in case the narcotic medicine or anesthesia causes nausea - take 1 tablet every 6 hours only if nauseated.  - Take ibuprofen 800 mg every 8 hours WITH food to reduce post-operative knee swelling.  Alternate with Tylenol so you receive either  ibuprofen or Tylenol every 4 hours.  DO NOT STOP IBUPROFEN POST-OP UNTIL INSTRUCTED TO DO SO at first post-op office visit (10-14 days after surgery). However, please discontinue if you have any abdominal discomfort after taking this.  - Take enteric coated aspirin 325 mg once daily for 2 weeks to prevent blood clots.    6. Bandages: The physical therapist should change the bandages at the first post-op appointment. If needed, the dressing supplies have been provided to you.   7. Physical Therapy: 1-2 times per week for 6 weeks. Therapy typically starts on post operative Day 3 or 4. You have been provided an order for physical therapy. The therapist will provide home exercises.   8. Work: May return to full work usually around 2 weeks after 1st post-operative visit. May do light duty/desk job in approximately 1-2 weeks when off of narcotics, pain is well-controlled, and swelling has decreased. Labor intensive jobs may require 4-6 weeks to return.      9. Post-Op Appointments: Your first post-op appointment will be with Dr. Posey Pronto in approximately 2 weeks time.    If you find that they have not been scheduled please call the Orthopaedic Appointment front desk at [phone].    General Anesthesia, Adult, Care After This sheet gives you information about how to care for yourself after your procedure. Your health care provider may also give you more specific instructions. If you have problems or questions, contact your health care  provider. What can I expect after the procedure? After the procedure, the following side effects are common:  Pain or discomfort at the IV site.  Nausea.  Vomiting.  Sore throat.  Trouble concentrating.  Feeling cold or chills.  Weak or tired.  Sleepiness and fatigue.  Soreness and body aches. These side effects can affect parts of the body that were not involved in surgery. Follow these instructions at home:  For at least 24 hours after the  procedure:  Have a responsible adult stay with you. It is important to have someone help care for you until you are awake and alert.  Rest as needed.  Do not: ? Participate in activities in which you could fall or become injured. ? Drive. ? Use heavy machinery. ? Drink alcohol. ? Take sleeping pills or medicines that cause drowsiness. ? Make important decisions or sign legal documents. ? Take care of children on your own. Eating and drinking  Follow any instructions from your health care provider about eating or drinking restrictions.  When you feel hungry, start by eating small amounts of foods that are soft and easy to digest (bland), such as toast. Gradually return to your regular diet.  Drink enough fluid to keep your urine pale yellow.  If you vomit, rehydrate by drinking water, juice, or clear broth. General instructions  If you have sleep apnea, surgery and certain medicines can increase your risk for breathing problems. Follow instructions from your health care provider about wearing your sleep device: ? Anytime you are sleeping, including during daytime naps. ? While taking prescription pain medicines, sleeping medicines, or medicines that make you drowsy.  Return to your normal activities as told by your health care provider. Ask your health care provider what activities are safe for you.  Take over-the-counter and prescription medicines only as told by your health care provider.  If you smoke, do not smoke without supervision.  Keep all follow-up visits as told by your health care provider. This is important. Contact a health care provider if:  You have nausea or vomiting that does not get better with medicine.  You cannot eat or drink without vomiting.  You have pain that does not get better with medicine.  You are unable to pass urine.  You develop a skin rash.  You have a fever.  You have redness around your IV site that gets worse. Get help right away  if:  You have difficulty breathing.  You have chest pain.  You have blood in your urine or stool, or you vomit blood. Summary  After the procedure, it is common to have a sore throat or nausea. It is also common to feel tired.  Have a responsible adult stay with you for the first 24 hours after general anesthesia. It is important to have someone help care for you until you are awake and alert.  When you feel hungry, start by eating small amounts of foods that are soft and easy to digest (bland), such as toast. Gradually return to your regular diet.  Drink enough fluid to keep your urine pale yellow.  Return to your normal activities as told by your health care provider. Ask your health care provider what activities are safe for you. This information is not intended to replace advice given to you by your health care provider. Make sure you discuss any questions you have with your health care provider. Document Revised: 05/27/2017 Document Reviewed: 01/07/2017 Elsevier Patient Education  [place].

## 2019-10-19 NOTE — Anesthesia Procedure Notes (Signed)
Procedure Name: LMA Insertion Date/Time: 10/19/2019 7:40 AM Performed by: Cameron Ali, CRNA Pre-anesthesia Checklist: Patient identified, Emergency Drugs available, Suction available, Timeout performed and Patient being monitored Patient Re-evaluated:Patient Re-evaluated prior to induction Oxygen Delivery Method: Circle system utilized Preoxygenation: Pre-oxygenation with 100% oxygen Induction Type: IV induction LMA: LMA inserted LMA Size: 4.0 Number of attempts: 1 Placement Confirmation: positive ETCO2 and breath sounds checked- equal and bilateral Tube secured with: Tape Dental Injury: Teeth and Oropharynx as per pre-operative assessment

## 2019-10-19 NOTE — Anesthesia Postprocedure Evaluation (Signed)
Anesthesia Post Note  Patient: Laurie Higgins  Procedure(s) Performed: right knee arthroscopic partial lateral meniscectomy and subchondroplasty of the lateral tibial plateau (Right Knee)     Patient location during evaluation: PACU Anesthesia Type: General Level of consciousness: awake and alert, oriented and patient cooperative Pain management: pain level controlled Vital Signs Assessment: post-procedure vital signs reviewed and stable Respiratory status: spontaneous breathing, nonlabored ventilation and respiratory function stable Cardiovascular status: blood pressure returned to baseline and stable Postop Assessment: adequate PO intake Anesthetic complications: no    Darrin Nipper

## 2019-10-22 ENCOUNTER — Encounter: Payer: Self-pay | Admitting: *Deleted

## 2020-01-15 ENCOUNTER — Other Ambulatory Visit: Payer: Self-pay | Admitting: Orthopedic Surgery

## 2020-01-15 DIAGNOSIS — R29898 Other symptoms and signs involving the musculoskeletal system: Secondary | ICD-10-CM

## 2020-01-17 ENCOUNTER — Other Ambulatory Visit: Payer: Self-pay

## 2020-01-17 ENCOUNTER — Ambulatory Visit
Admission: RE | Admit: 2020-01-17 | Discharge: 2020-01-17 | Disposition: A | Discharge status: Home / Self Care | Payer: Managed Care, Other (non HMO) | Source: Ambulatory Visit | Attending: Orthopedic Surgery | Admitting: Orthopedic Surgery

## 2020-01-17 DIAGNOSIS — R29898 Other symptoms and signs involving the musculoskeletal system: Secondary | ICD-10-CM | POA: Diagnosis not present

## 2020-04-14 ENCOUNTER — Other Ambulatory Visit: Payer: Self-pay

## 2020-04-14 ENCOUNTER — Ambulatory Visit (INDEPENDENT_AMBULATORY_CARE_PROVIDER_SITE_OTHER): Payer: Managed Care, Other (non HMO)

## 2020-04-14 ENCOUNTER — Ambulatory Visit
Admission: EM | Admit: 2020-04-14 | Discharge: 2020-04-14 | Disposition: A | Discharge status: Home / Self Care | Payer: Managed Care, Other (non HMO) | Attending: Physician Assistant | Admitting: Physician Assistant

## 2020-04-14 ENCOUNTER — Encounter: Payer: Self-pay | Admitting: Emergency Medicine

## 2020-04-14 DIAGNOSIS — B349 Viral infection, unspecified: Secondary | ICD-10-CM | POA: Diagnosis not present

## 2020-04-14 DIAGNOSIS — Z79899 Other long term (current) drug therapy: Secondary | ICD-10-CM | POA: Insufficient documentation

## 2020-04-14 DIAGNOSIS — R051 Acute cough: Secondary | ICD-10-CM | POA: Insufficient documentation

## 2020-04-14 DIAGNOSIS — Z9012 Acquired absence of left breast and nipple: Secondary | ICD-10-CM | POA: Diagnosis not present

## 2020-04-14 DIAGNOSIS — E785 Hyperlipidemia, unspecified: Secondary | ICD-10-CM | POA: Diagnosis not present

## 2020-04-14 DIAGNOSIS — R0602 Shortness of breath: Secondary | ICD-10-CM

## 2020-04-14 DIAGNOSIS — M546 Pain in thoracic spine: Secondary | ICD-10-CM | POA: Diagnosis not present

## 2020-04-14 DIAGNOSIS — E039 Hypothyroidism, unspecified: Secondary | ICD-10-CM | POA: Diagnosis not present

## 2020-04-14 DIAGNOSIS — R059 Cough, unspecified: Secondary | ICD-10-CM

## 2020-04-14 DIAGNOSIS — R0989 Other specified symptoms and signs involving the circulatory and respiratory systems: Secondary | ICD-10-CM

## 2020-04-14 DIAGNOSIS — Z882 Allergy status to sulfonamides status: Secondary | ICD-10-CM | POA: Diagnosis not present

## 2020-04-14 DIAGNOSIS — Z853 Personal history of malignant neoplasm of breast: Secondary | ICD-10-CM | POA: Insufficient documentation

## 2020-04-14 DIAGNOSIS — Z20822 Contact with and (suspected) exposure to covid-19: Secondary | ICD-10-CM | POA: Insufficient documentation

## 2020-04-14 DIAGNOSIS — Z888 Allergy status to other drugs, medicaments and biological substances status: Secondary | ICD-10-CM | POA: Diagnosis not present

## 2020-04-14 DIAGNOSIS — Z87891 Personal history of nicotine dependence: Secondary | ICD-10-CM | POA: Insufficient documentation

## 2020-04-14 DIAGNOSIS — Z885 Allergy status to narcotic agent status: Secondary | ICD-10-CM | POA: Diagnosis not present

## 2020-04-14 DIAGNOSIS — Z791 Long term (current) use of non-steroidal anti-inflammatories (NSAID): Secondary | ICD-10-CM | POA: Diagnosis not present

## 2020-04-14 DIAGNOSIS — Z79890 Hormone replacement therapy (postmenopausal): Secondary | ICD-10-CM | POA: Diagnosis not present

## 2020-04-14 LAB — SARS CORONAVIRUS 2 (TAT 6-24 HRS): SARS Coronavirus 2: NEGATIVE

## 2020-04-14 MED ORDER — PSEUDOEPH-BROMPHEN-DM 30-2-10 MG/5ML PO SYRP: 10.0000 mL | ORAL_SOLUTION | Freq: Four times a day (QID) | ORAL | 0 refills | Status: AC | PRN

## 2020-04-14 NOTE — ED Provider Notes (Signed)
[facility]    CSN: [id number] Arrival date & time: 04/14/20  1147      History   Chief Complaint Chief Complaint  Patient presents with  . Cough  . chest congestion  . Shortness of Breath    HPI Laurie Higgins is a 64 y.o. female presenting for 4-day history of sinus congestion, productive cough, chest congestion, fatigue and feeling short of breath at times.  Patient denies any known Covid exposure.  Patient not vaccinated for Covid.  She says that her 2 grandchildren have been sick with similar symptoms and one of them supposedly had a negative Covid test last week.  She denies any known fever, chills or sweats.  Denies chest pain.  Admits to mild central upper back pain that is intermittent.  Past medical history significant for breast cancer, hyperlipidemia, thyroid abnormality, chronic back pain.  Patient states she did have a PE in 2019 which was secondary to a problem with her port.  Denies taking any blood thinning medications currently.  She has not been taking any over-the-counter medication for symptoms.  No other concerns.  HPI  Past Medical History:  Diagnosis Date  . Back pain, lumbosacral    muscular vs spinal, sees chiropractor sometimes  . Cancer ([place])   . Capsulitis of left foot   . Hypercholesteremia   . Hyperlipidemia   . Hypothyroid   . PE (pulmonary thromboembolism) ([place]) 03/2018   from blood clot at port site  . Peripheral neuropathy     Patient Active Problem List   Diagnosis Date Noted  . 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 Degenerative arthritis of right knee 11/29/2016  . Obesity (BMI 30-39.9) 06/15/2016  . Special screening for malignant neoplasms, colon   . Benign neoplasm of sigmoid colon   . Hypothyroidism 03/06/2015  . Pure hypercholesterolemia 03/06/2015  . Breast mass seen on mammogram 03/06/2015  . 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 CESAREAN SECTION  06/08/1988  . COLONOSCOPY WITH PROPOFOL N/A 03/17/2015   Procedure: COLONOSCOPY WITH PROPOFOL;  Surgeon: Lucilla Lame, MD;  Location: [place];  Service: Endoscopy;  Laterality: N/A;  SIGMOID COLON POLYPS  X  4    . KNEE ARTHROSCOPY Right 10/19/2019   Procedure: right knee arthroscopic partial lateral meniscectomy and subchondroplasty of the lateral tibial plateau;  Surgeon: Leim Fabry, MD;  Location: [place];  Service: Orthopedics;  Laterality: Right;  . MASTECTOMY Left    w/ lymph nodes  . TUBAL LIGATION Bilateral 1995  . WISDOM TOOTH EXTRACTION     age 76    OB History   No obstetric history on file.      Home Medications    Prior to Admission medications   Medication Sig Start Date End Date Taking? Authorizing Provider  ANASTROZOLE PO Take by mouth.   Yes [provider]  Bioflavonoid Products (ESTER-C PO) Take 1 tablet by mouth 3 (three) times daily.   Yes [provider]  docusate sodium (COLACE) 100 MG capsule Take 100 mg by mouth 2 (two) times daily.   Yes [provider]  levothyroxine (SYNTHROID, LEVOTHROID) 100 MCG tablet Take 1 tablet (100 mcg total) by mouth daily. 06/09/16  Yes Fisher, Linden Dolin, PA-C  Multiple Vitamin (MULTIVITAMIN ADULT PO) Take by mouth.   Yes [provider]  Multiple Vitamins-Minerals (ZINC PO) Take by mouth daily.   Yes [provider]  QUERCETIN PO Take by mouth daily.   Yes [provider]  brompheniramine-pseudoephedrine-DM 30-2-10 MG/5ML syrup Take 10 mLs by mouth 4 (four) times daily as needed for up to 7 days. 04/14/20 04/21/20  Danton Clap, PA-C  Cholecalciferol 50 MCG (2000 UT) CAPS Take 1 tablet by mouth daily.    [provider]  Diclofenac Sodium (PENNSAID) 2 % SOLN Place 2 application onto the skin 2 (two) times daily. 11/29/16   Lyndal Pulley, DO  LORazepam (ATIVAN) 1 MG tablet Take 1 mg by mouth every 8 (eight) hours.     [provider]  meloxicam (MOBIC) 7.5 MG tablet Take 7.5 mg by mouth daily. 03/25/20   [provider]  ondansetron (ZOFRAN ODT) 4 MG disintegrating tablet Take 1 tablet (4 mg total) by mouth every 8 (eight) hours as needed for nausea or vomiting. 10/19/19   Leim Fabry, MD  oxyCODONE (ROXICODONE) 5 MG immediate release tablet Take 1 tablet (5 mg total) by mouth every 4 (four) hours as needed (pain). 10/19/19 10/18/20  Leim Fabry, MD  traMADol (ULTRAM) 50 MG tablet Take 2 tablets (100 mg total) by mouth every 8 (eight) hours as needed. 10/19/19 10/18/20  Leim Fabry, MD    Family History Family History  Problem Relation Age of Onset  . Breast cancer Mother 25  . Atrial fibrillation Mother   . Stroke Mother   . Bladder Cancer Father   . Hypertension Father     Social History Social History   Tobacco Use  . Smoking status: Former Smoker    Packs/day: 1.00    Years: 40.00    Pack years: 40.00    Types: Cigarettes    Quit date: 04/04/2018    Years since quitting: 2.0  . Smokeless tobacco: Never Used  Vaping Use  . Vaping Use: Never used  Substance Use Topics  . Alcohol use: No    Alcohol/week: 0.0 standard drinks  . Drug use: No     Allergies   Cholecalciferol, Ergocalciferol, Oxycodone, Yellow jacket venom, Simvastatin, Acetaminophen, Gabapentin, Hydrocodone, Lactose intolerance (gi), and Sulfa antibiotics   Review of Systems Review of Systems  Constitutional: Positive for fatigue. Negative for chills, diaphoresis and fever.  HENT: Positive for congestion and rhinorrhea. Negative for ear pain, sinus pressure, sinus pain and sore throat.   Respiratory: Positive for cough and shortness of breath. Negative for wheezing.   Cardiovascular: Negative for chest pain, palpitations and leg swelling.  Gastrointestinal: Negative for abdominal pain, nausea and vomiting.  Musculoskeletal: Negative for arthralgias and myalgias.  Skin: Negative for rash.    Neurological: Negative for weakness and headaches.  Hematological: Negative for adenopathy.     Physical Exam Triage Vital Signs ED Triage Vitals  Enc Vitals Group     BP 04/14/20 1224 117/72     Pulse Rate 04/14/20 1224 84     Resp 04/14/20 1224 18     Temp 04/14/20 1224 98.3 F (36.8 C)     Temp Source 04/14/20 1224 Oral     SpO2 04/14/20 1224 98 %     Weight 04/14/20 1225 209 lb (94.8 kg)     Height 04/14/20 1225 5\' 5"  (1.651 m)     Head Circumference --      Peak Flow --      Pain Score 04/14/20 1223 2     Pain Loc --      Pain Edu? --      Excl. in [place]? --  No data found.  Updated Vital Signs BP 117/72 (BP Location: Right Arm)   Pulse 84   Temp 98.3 F (36.8 C) (Oral)   Resp 18   Ht 5\' 5"  (1.651 m)   Wt 209 lb (94.8 kg)   SpO2 98%   BMI 34.78 kg/m      Physical Exam Vitals and nursing note reviewed.  Constitutional:      General: She is not in acute distress.    Appearance: Normal appearance. She is not ill-appearing or toxic-appearing.  HENT:     Head: Normocephalic and atraumatic.     Nose: Rhinorrhea present.     Mouth/Throat:     Mouth: Mucous membranes are moist.     Pharynx: Oropharynx is clear.  Eyes:     General: No scleral icterus.       Right eye: No discharge.        Left eye: No discharge.     Conjunctiva/sclera: Conjunctivae normal.  Cardiovascular:     Rate and Rhythm: Normal rate and regular rhythm.     Heart sounds: Normal heart sounds.  Pulmonary:     Effort: Pulmonary effort is normal. No respiratory distress.     Breath sounds: Normal breath sounds. No wheezing, rhonchi or rales.  Musculoskeletal:     Cervical back: Neck supple.  Skin:    General: Skin is dry.  Neurological:     General: No focal deficit present.     Mental Status: She is alert. Mental status is at baseline.     Motor: No weakness.     Gait: Gait normal.  Psychiatric:        Mood and Affect: Mood normal.        Behavior: Behavior normal.         Thought Content: Thought content normal.      UC Treatments / Results  Labs (all labs ordered are listed, but only abnormal results are displayed) Labs Reviewed  SARS CORONAVIRUS 2 (TAT 6-24 HRS)    EKG   Radiology DG Chest 2 View  Result Date: 04/14/2020 CLINICAL DATA:  Chest congestion shortness of breath for 4 days. EXAM: CHEST - 2 VIEW COMPARISON:  PA and lateral chest 12/29/2010. FINDINGS: The lungs are clear. Heart size is normal. No pneumothorax or pleural effusion. No acute or focal bony abnormality. The patient is status post left mastectomy and axillary dissection. IMPRESSION: No acute disease. Electronically Signed   By: Inge Rise M.D.   On: 04/14/2020 12:50    Procedures Procedures (including critical care time)  Medications Ordered in UC Medications - No data to display  Initial Impression / Assessment and Plan / UC Course  I have reviewed the triage vital signs and the nursing notes.  Pertinent labs & imaging results that were available during my care of the patient were reviewed by me and considered in my medical decision making (see chart for details).    Chest x-ray performed due to complaint of shortness of breath.  All vital signs were normal and stable and chest clear to auscultation.  Chest x-ray within normal limits.  I did personally review this myself.  Covid testing obtained.  CDC guidelines, isolation protocol and ED precautions discussed if positive.  Discussed MAB therapy if patient testing positive for Covid.  She states that she may be interested in.  Patient did state that she has got "ivermectin coming."  I discussed with her that there is no proven effectiveness behind that treatment.  She states she will try it anyway if it comes in.   Very low suspicion for PE given symptoms and her exposure to other sick contacts.  Additionally, all vitals are normal and she is not in any respiratory distress.  Denies any chest pain or palpitations.  I  did discuss with her that she should go to the ED if she has any chest pain, fever or worsening breathing difficulty to rule out PE.  Patient is agreeable.  At this time, advised rest, increasing fluids.  Sent Bromfed to pharmacy for cough.  Offered inhaler but she declined.  Advised to follow-up with our clinic as needed for any new or worsening symptoms.  Final Clinical Impressions(s) / UC Diagnoses   Final diagnoses:  Viral illness  Cough  Shortness of breath     Discharge Instructions     URI/COLD SYMPTOMS: Your exam today is consistent with a viral illness. Antibiotics are not indicated at this time. Use medications as directed, including cough syrup, nasal saline, and decongestants. Your symptoms should improve over the next few days and resolve within 7-10 days. Increase rest and fluids. F/u if symptoms worsen or predominate such as sore throat, ear pain, productive cough, shortness of breath, or if you develop high fevers or worsening fatigue over the next several days.    You have received COVID testing today either for positive exposure, concerning symptoms that could be related to COVID infection, screening purposes, or re-testing after confirmed positive.  Your test obtained today checks for active viral infection in the last 1-2 weeks. If your test is negative now, you can still test positive later. So, if you do develop symptoms you should either get re-tested and/or isolate x 10 days. Please follow CDC guidelines.  While Rapid antigen tests come back in 15-20 minutes, send out PCR/molecular test results typically come back within 24 hours. In the mean time, if you are symptomatic, assume this could be a positive test and treat/monitor yourself as if you do have COVID.   We will call with test results. Please download the MyChart app and set up a profile to access test results.   If symptomatic, go home and rest. Push fluids. Take Tylenol as needed for discomfort. Gargle warm  salt water. Throat lozenges. Take Mucinex DM or Robitussin for cough. Humidifier in bedroom to ease coughing. Warm showers. Also review the COVID handout for more information.  COVID-19 INFECTION: The incubation period of COVID-19 is approximately 14 days after exposure, with most symptoms developing in roughly 4-5 days. Symptoms may range in severity from mild to critically severe. Roughly 80% of those infected will have mild symptoms. People of any age may become infected with COVID-19 and have the ability to transmit the virus. The most common symptoms include: fever, fatigue, cough, body aches, headaches, sore throat, nasal congestion, shortness of breath, nausea, vomiting, diarrhea, changes in smell and/or taste.    COURSE OF ILLNESS Some patients may begin with mild disease which can progress quickly into critical symptoms. If your symptoms are worsening please call ahead to the Emergency Department and proceed there for further treatment. Recovery time appears to be roughly 1-2 weeks for mild symptoms and 3-6 weeks for severe disease.   GO IMMEDIATELY TO ER FOR FEVER YOU ARE UNABLE TO GET DOWN WITH TYLENOL, BREATHING PROBLEMS, CHEST PAIN, FATIGUE, LETHARGY, INABILITY TO EAT OR DRINK, ETC  QUARANTINE AND ISOLATION: To help decrease the spread of COVID-19 please remain isolated if you have COVID infection  or are highly suspected to have COVID infection. This means -stay home and isolate to one room in the home if you live with others. Do not share a bed or bathroom with others while ill, sanitize and wipe down all countertops and keep common areas clean and disinfected. You may discontinue isolation if you have a mild case and are asymptomatic 10 days after symptom onset as long as you have been fever free >24 hours without having to take Motrin or Tylenol. If your case is more severe (meaning you develop pneumonia or are admitted in the hospital), you may have to isolate longer.   If you have been in  close contact (within 6 feet) of someone diagnosed with COVID 19, you are advised to quarantine in your home for 14 days as symptoms can develop anywhere from 2-14 days after exposure to the virus. If you develop symptoms, you  must isolate.  Most current guidelines for COVID after exposure -isolate 10 days if you ARE NOT tested for COVID as long as symptoms do not develop -isolate 7 days if you are tested and remain asymptomatic -You do not necessarily need to be tested for COVID if you have + exposure and        develop   symptoms. Just isolate at home x10 days from symptom onset During this global pandemic, CDC advises to practice social distancing, try to stay at least 69ft away from others at all times. Wear a face covering. Wash and sanitize your hands regularly and avoid going anywhere that is not necessary.  KEEP IN MIND THAT THE COVID TEST IS NOT 100% ACCURATE AND YOU SHOULD STILL DO EVERYTHING TO PREVENT POTENTIAL SPREAD OF VIRUS TO OTHERS (WEAR MASK, WEAR GLOVES, [place] HANDS AND SANITIZE REGULARLY). IF INITIAL TEST IS NEGATIVE, THIS MAY NOT MEAN YOU ARE DEFINITELY NEGATIVE. MOST ACCURATE TESTING IS DONE 5-7 DAYS AFTER EXPOSURE.   It is not advised by CDC to get re-tested after receiving a positive COVID test since you can still test positive for weeks to months after you have already cleared the virus.   *If you have not been vaccinated for COVID, I strongly suggest you consider getting vaccinated as long as there are no contraindications.      ED Prescriptions    Medication Sig Dispense Auth. Provider   brompheniramine-pseudoephedrine-DM 30-2-10 MG/5ML syrup Take 10 mLs by mouth 4 (four) times daily as needed for up to 7 days. 120 mL Danton Clap, PA-C     PDMP not reviewed this encounter.   Danton Clap, PA-C 04/14/20 1316

## 2020-04-14 NOTE — Discharge Instructions (Addendum)

## 2020-04-14 NOTE — ED Triage Notes (Signed)
Patient in today c/o cough, chest congestion and sob x 4 days. Patient denies fever. Patient has not taken any OTC medications for her symptoms. Patient has not had the covid vaccine.

## 2021-03-02 ENCOUNTER — Encounter: Payer: Self-pay | Admitting: Podiatry

## 2021-03-02 ENCOUNTER — Other Ambulatory Visit: Payer: Self-pay

## 2021-03-09 ENCOUNTER — Other Ambulatory Visit: Payer: Self-pay | Admitting: Podiatry

## 2021-03-17 NOTE — Discharge Instructions (Addendum)
[place]  POST OPERATIVE INSTRUCTIONS FOR DR. Vickki Muff AND DR. [place]   Take your medication as prescribed.  Pain medication should be taken only as needed.  If still having severe pain or worsening pain you may try taking 1 pain tablet every 4 hours instead of every 6.  You may also take ibuprofen and Tylenol between doses as needed.  Keep the dressing clean, dry and intact.  Stay nonweightbearing at all times to the left lower extremity.  Keep your foot elevated above the heart level for the first 48 hours.  Continue elevation thereafter to reduce swelling.  You may also ice anterior aspect of the left ankle for maximum 10 minutes out of every 1 hour as needed.  Take antibiotics as prescribed until gone.  Also begin taking aspirin 81 mg twice daily starting tomorrow.  Always wear your post-op shoe when walking.  Always use your crutches if you are to be non-weight bearing.  Do not take a shower. Baths are permissible as long as the foot is kept out of the water.   Every hour you are awake:  Bend your knee 15 times. Massage calf 15 times  Call [facility] [phone]) if any of the following problems occur: You develop a temperature or fever. The bandage becomes saturated with blood. Medication does not stop your pain. Injury of the foot occurs. Any symptoms of infection including redness, odor, or red streaks running from wound. *  PERIPHERAL NERVE BLOCK PATIENT INFORMATION  Your surgeon has requested a peripheral nerve block for your surgery. This anesthetic technique provides excellent post-operative pain relief for you in a safe and effective manner. It will also help reduce the risk of nausea and vomiting and allow earlier discharge from the hospital.   The block is performed under sedation with ultrasound guidance prior to your procedure. Due to the sedation, your may or may not remember the  block experience. The nerve block will begin to take effect anywhere from 5 to 30 minutes after being administered. You will be transported to the operating room from your surgery after the block is completed.   At the end of surgery, when the anesthesia wears off, you will notice a few things. Your may not be able to move or feel the part of your body targeted by the nerve block. These are normal experiences, and they will disappear as the block wears off.  If you had an interscalene nerve block performed (which is common for shoulder surgery), your voice can be very hoarse and you may feel that you are not able to take as deep a breath as you did before surgery. Some patients may also notice a droopy eyelid on the affected side. These symptoms will resolve once the block wears off.  Pain control: The nerve block technique used is a single injection that can last anywhere from 1-3 days. The duration of the numbness can vary between individuals. After leaving the hospital, it is important that you begin to take your prescribed pain medication when you start to sense the nerve block wearing off. This will help you avoid unpleasant pain at the time the nerve block wears off, which can sometimes be in the middle of the night. The block will only cover pain in the areas targeted by the nerve block so if you experience surgical pain outside of that area, please take your prescribed pain medication. Management of the "numb area": After a  nerve block, you cannot feel pain, pressure, or temperature in the affected area so there is an increased risk for injury. You should take extra care to protect the affected areas until sensation and movement returns. Please take caution to not come in contact with extremely hot or cold items because you will not be able to sense or protect yourself form the extremes of temperature.  You may experience some persistent numbness after the procedure by most neurological deficits  resolve over time and the incidence of serious long term neurological complications attributable to peripheral nerve blocks are relatively uncommon.

## 2021-03-19 ENCOUNTER — Encounter: Payer: Self-pay | Admitting: Podiatry

## 2021-03-19 ENCOUNTER — Ambulatory Visit: Payer: Managed Care, Other (non HMO) | Admitting: Anesthesiology

## 2021-03-19 ENCOUNTER — Ambulatory Visit
Admission: RE | Admit: 2021-03-19 | Discharge: 2021-03-19 | Disposition: A | Discharge status: Home / Self Care | Payer: Managed Care, Other (non HMO) | Attending: Podiatry | Admitting: Podiatry

## 2021-03-19 ENCOUNTER — Other Ambulatory Visit: Payer: Self-pay

## 2021-03-19 ENCOUNTER — Encounter
Admission: RE | Disposition: A | Discharge status: Home / Self Care | Payer: Self-pay | Source: Home / Self Care | Attending: Podiatry

## 2021-03-19 DIAGNOSIS — G629 Polyneuropathy, unspecified: Secondary | ICD-10-CM | POA: Diagnosis not present

## 2021-03-19 DIAGNOSIS — Z885 Allergy status to narcotic agent status: Secondary | ICD-10-CM | POA: Diagnosis not present

## 2021-03-19 DIAGNOSIS — Z886 Allergy status to analgesic agent status: Secondary | ICD-10-CM | POA: Insufficient documentation

## 2021-03-19 DIAGNOSIS — M19072 Primary osteoarthritis, left ankle and foot: Secondary | ICD-10-CM | POA: Insufficient documentation

## 2021-03-19 DIAGNOSIS — Z87891 Personal history of nicotine dependence: Secondary | ICD-10-CM | POA: Diagnosis not present

## 2021-03-19 DIAGNOSIS — M21372 Foot drop, left foot: Secondary | ICD-10-CM | POA: Insufficient documentation

## 2021-03-19 DIAGNOSIS — M216X2 Other acquired deformities of left foot: Secondary | ICD-10-CM | POA: Diagnosis present

## 2021-03-19 DIAGNOSIS — Z882 Allergy status to sulfonamides status: Secondary | ICD-10-CM | POA: Insufficient documentation

## 2021-03-19 DIAGNOSIS — Z09 Encounter for follow-up examination after completed treatment for conditions other than malignant neoplasm: Secondary | ICD-10-CM

## 2021-03-19 DIAGNOSIS — M65872 Other synovitis and tenosynovitis, left ankle and foot: Secondary | ICD-10-CM | POA: Diagnosis not present

## 2021-03-19 DIAGNOSIS — Z888 Allergy status to other drugs, medicaments and biological substances status: Secondary | ICD-10-CM | POA: Insufficient documentation

## 2021-03-19 HISTORY — PX: ANKLE ARTHROSCOPY: SHX545

## 2021-03-19 SURGERY — ARTHROSCOPY, ANKLE
Anesthesia: General | Site: Ankle | Laterality: Left

## 2021-03-19 MED ORDER — POVIDONE-IODINE 7.5 % EX SOLN: Freq: Once | CUTANEOUS | Status: AC

## 2021-03-19 MED ORDER — OXYCODONE-ACETAMINOPHEN 5-325 MG PO TABS: 1.0000 | ORAL_TABLET | Freq: Four times a day (QID) | ORAL | 0 refills | Status: DC | PRN

## 2021-03-19 MED ORDER — TISSEEL VH 4 ML EX KIT
PACK | CUTANEOUS | Status: DC | PRN
  Administered 2021-03-19: 2 mL via TOPICAL

## 2021-03-19 MED ORDER — PROPOFOL 10 MG/ML IV BOLUS
INTRAVENOUS | Status: DC | PRN
  Administered 2021-03-19: 200 mg via INTRAVENOUS

## 2021-03-19 MED ORDER — CEFAZOLIN SODIUM-DEXTROSE 2-4 GM/100ML-% IV SOLN
2.0000 g | INTRAVENOUS | Status: AC
  Administered 2021-03-19: 2 g via INTRAVENOUS

## 2021-03-19 MED ORDER — SODIUM CHLORIDE 0.9 % IR SOLN
Status: DC | PRN
  Administered 2021-03-19: 1000 mL

## 2021-03-19 MED ORDER — AMOXICILLIN-POT CLAVULANATE 875-125 MG PO TABS: 1.0000 | ORAL_TABLET | Freq: Two times a day (BID) | ORAL | 0 refills | Status: AC

## 2021-03-19 MED ORDER — LACTATED RINGERS IR SOLN
Status: DC | PRN
  Administered 2021-03-19: 3000 mL
  Administered 2021-03-19: 6000 mL

## 2021-03-19 MED ORDER — LACTATED RINGERS IV SOLN: INTRAVENOUS | Status: DC

## 2021-03-19 MED ORDER — GLYCOPYRROLATE 0.2 MG/ML IJ SOLN
INTRAMUSCULAR | Status: DC | PRN
  Administered 2021-03-19: .1 mg via INTRAVENOUS

## 2021-03-19 MED ORDER — ONDANSETRON HCL 4 MG PO TABS: 4.0000 mg | ORAL_TABLET | Freq: Three times a day (TID) | ORAL | 0 refills | Status: AC | PRN

## 2021-03-19 MED ORDER — ROPIVACAINE HCL 5 MG/ML IJ SOLN
INTRAMUSCULAR | Status: DC | PRN
  Administered 2021-03-19: 40 mL via PERINEURAL

## 2021-03-19 MED ORDER — FENTANYL CITRATE (PF) 100 MCG/2ML IJ SOLN
INTRAMUSCULAR | Status: DC | PRN
  Administered 2021-03-19: 100 ug via INTRAVENOUS

## 2021-03-19 MED ORDER — DEXAMETHASONE SODIUM PHOSPHATE 4 MG/ML IJ SOLN
INTRAMUSCULAR | Status: DC | PRN
  Administered 2021-03-19: 4 mg via INTRAVENOUS

## 2021-03-19 MED ORDER — LIDOCAINE HCL (CARDIAC) PF 100 MG/5ML IV SOSY
PREFILLED_SYRINGE | INTRAVENOUS | Status: DC | PRN
  Administered 2021-03-19: 40 mg via INTRATRACHEAL

## 2021-03-19 MED ORDER — FENTANYL CITRATE PF 50 MCG/ML IJ SOSY: 25.0000 ug | PREFILLED_SYRINGE | INTRAMUSCULAR | Status: DC | PRN

## 2021-03-19 MED ORDER — ROPIVACAINE HCL 5 MG/ML IJ SOLN: INTRAMUSCULAR | Status: DC | PRN

## 2021-03-19 MED ORDER — ASPIRIN EC 81 MG PO TBEC: 81.0000 mg | DELAYED_RELEASE_TABLET | Freq: Two times a day (BID) | ORAL | 0 refills | Status: AC

## 2021-03-19 MED ORDER — ONDANSETRON HCL 4 MG/2ML IJ SOLN
INTRAMUSCULAR | Status: DC | PRN
  Administered 2021-03-19: 4 mg via INTRAVENOUS

## 2021-03-19 MED ORDER — OXYCODONE HCL 5 MG PO TABS
5.0000 mg | ORAL_TABLET | Freq: Four times a day (QID) | ORAL | Status: DC | PRN
Start: 2021-03-19 — End: 2021-03-19

## 2021-03-19 MED ORDER — MIDAZOLAM HCL 5 MG/5ML IJ SOLN
INTRAMUSCULAR | Status: DC | PRN
  Administered 2021-03-19: 2 mg via INTRAVENOUS

## 2021-03-19 MED ORDER — OXYCODONE HCL 5 MG PO TABS: 5.0000 mg | ORAL_TABLET | Freq: Four times a day (QID) | ORAL | 0 refills | Status: AC | PRN

## 2021-03-19 SURGICAL SUPPLY — 42 items
BNDG CMPR STD VLCR NS LF 5.8X4 (GAUZE/BANDAGES/DRESSINGS) ×2
BNDG CMPR STD VLCR NS LF 5.8X6 (GAUZE/BANDAGES/DRESSINGS) ×1
BNDG COHESIVE 4X5 TAN ST LF (GAUZE/BANDAGES/DRESSINGS) ×2 IMPLANT
BNDG ELASTIC 4X5.8 VLCR NS LF (GAUZE/BANDAGES/DRESSINGS) ×4 IMPLANT
BNDG ELASTIC 6X5.8 VLCR NS LF (GAUZE/BANDAGES/DRESSINGS) ×2 IMPLANT
BNDG ESMARK 4X12 TAN STRL LF (GAUZE/BANDAGES/DRESSINGS) ×2 IMPLANT
BNDG GAUZE ELAST 4 BULKY (GAUZE/BANDAGES/DRESSINGS) ×2 IMPLANT
BUR ABRADER 2.9 MAG MINI (BURR) IMPLANT
BURR  ABRADER 2.9 MAG MINI (BURR) ×1
BURR ABRADER 2.9 MAG MINI (BURR) ×1
COVER LIGHT HANDLE FLEXIBLE (MISCELLANEOUS) ×4 IMPLANT
CUFF TOURN SGL QUICK 30 (TOURNIQUET CUFF) ×2
CUFF TRNQT CYL 30X4X21-28X (TOURNIQUET CUFF) IMPLANT
DRAPE INCISE IOBAN 66X45 STRL (DRAPES) ×1 IMPLANT
DRAPE STERI 35X30 U-POUCH (DRAPES) ×2 IMPLANT
DURAPREP 26ML APPLICATOR (WOUND CARE) ×2 IMPLANT
GAUZE SPONGE 4X4 12PLY STRL (GAUZE/BANDAGES/DRESSINGS) ×2 IMPLANT
GAUZE XEROFORM 1X8 LF (GAUZE/BANDAGES/DRESSINGS) ×2 IMPLANT
GLOVE SURG ENC MOIS LTX SZ7 (GLOVE) ×2 IMPLANT
GLOVE SURG POLYISO LF SZ6.5 (GLOVE) ×2 IMPLANT
GLOVE SURG POLYISO LF SZ7 (GLOVE) ×2 IMPLANT
GOWN STRL REUS W/ TWL LRG LVL3 (GOWN DISPOSABLE) ×2 IMPLANT
GOWN STRL REUS W/TWL LRG LVL3 (GOWN DISPOSABLE) ×4
GRAFT TISSUE BIOCARTILAGE 1ML (Tissue) ×1 IMPLANT
IV LACTATED RINGER IRRG 3000ML (IV SOLUTION) ×6
IV LR IRRIG 3000ML ARTHROMATIC (IV SOLUTION) ×4 IMPLANT
KIT BIOCARTILAGE SM JOINT MIX (KITS) ×2 IMPLANT
KIT TURNOVER KIT A (KITS) ×2 IMPLANT
MANIFOLD 4PT FOR NEPTUNE1 (MISCELLANEOUS) ×2 IMPLANT
NDL SPNL 18GX3.5 QUINCKE PK (NEEDLE) ×1 IMPLANT
NEEDLE SPNL 18GX3.5 QUINCKE PK (NEEDLE) ×2 IMPLANT
PACK EXTREMITY ARMC (MISCELLANEOUS) ×2 IMPLANT
PADDING CAST BLEND 4X4 NS (MISCELLANEOUS) ×8 IMPLANT
PENCIL SMOKE EVACUATOR (MISCELLANEOUS) ×2 IMPLANT
SPLINT CAST 1 STEP 4X30 (MISCELLANEOUS) ×2 IMPLANT
STOCKINETTE TUB 6IN (MISCELLANEOUS) ×2 IMPLANT
STRAP ANKLE DISTRACTOR (MISCELLANEOUS) ×1 IMPLANT
SUT ETHILON 3-0 (SUTURE) ×1 IMPLANT
TUBING CONNECTING 10 (TUBING) ×1 IMPLANT
TUBING INFLOW SET DBFLO PUMP (TUBING) ×2 IMPLANT
TUBING OUTFLOW SET DBLFO PUMP (TUBING) ×2 IMPLANT
WAND TOPAZ MICRO DEBRIDER (MISCELLANEOUS) ×2 IMPLANT

## 2021-03-19 NOTE — Op Note (Signed)
PODIATRY / FOOT AND ANKLE SURGERY OPERATIVE REPORT    SURGEON: Caroline More, DPM  PRE-OPERATIVE DIAGNOSIS:   POST-OPERATIVE DIAGNOSIS:  1.  Left ankle synovitis 2.  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  Osteochondral defect left medial ankle at the talar dome around the medial shoulder which measured approximately 1 cm x 0.8 cm.  Subchondral bone appeared to be intact but cartilage appeared to be loose and friable. 2.  Mild ankle synovitis medially and centrally within the tissues.  PATHOLOGY/SPECIMEN(S): None  INDICATIONS:   Laurie Higgins is a 65 y.o. female who presents with chronic pain to the left medial ankle.  Patient had MRI imaging performed which did show an osteochondral defect to the medial shoulder of the talus.  Patient has been treated conservatively with bracing and boot applications as well as some forms of physical therapy and injection therapy.  Patient still continue to have pain and discomfort.  All treatment options were discussed with the patient both conservative and surgical attempts at correction at this time patient is elected for surgical procedure described above.  No guarantees given..  DESCRIPTION: After obtaining full informed written consent, the patient was brought back to the operating room and placed supine upon the operating table.  The patient received IV antibiotics prior to induction.  After obtaining adequate anesthesia, the patient was prepped and draped in the standard fashion.  A preoperative popliteal block was performed by anesthesia.  An Esmarch bandage was used to exsanguinate the left lower extremity  pneumatic thigh tourniquet was inflated.  Attention was then directed to the anterior medial aspect of the ankle where a small stab incision was made medial to the tendon of the tibialis anterior at the level of the ankle joint.  10 cc of normal sterile saline was injected into the ankle joint.  Blunt dissection was continued down to the ankle joint and the ankle joint was then entered.  A rush of fluid was then seen indicating successful entry into the ankle joint.  The obturator and cannula was placed into the ankle joint and the camera was then placed and the scope was then used to examine the ankle joint.  The ankle appeared to be fairly clean overall but did appear to have some synovitis at the medial and central aspects.  Also there appeared to be a loose osteochondral defect in which cartilage appeared to be friable from the subchondral bone at the medial gutter area of the talus as expected based on MRI imaging.  Transillumination was then performed to the anterior lateral aspect of the ankle joint a small stab incision was made to this area as well and blunt dissection was continued down with a hemostat and the ankle joint was then punctured with a hemostat and opened wide and a rush of fluid was seen indicating successful entry.  [place] was then placed through the lateral portal.  Debridement was then performed of any loose synovitic tissue at the anterior and anterior lateral as well as lateral aspects of the ankle joint.  There did not appear to be any cartilage damage to this area.  There appeared to be minimal synovitis but patient did have mild impingement at the anterior lateral aspect of the ankle  joint as some fibers tissue appeared to be rubbing on the talus with motion.  This area was resected.  The ankle appeared to have fairly clean range of motion to this area with no other impingement.  The instrumentation was then switched.  Similarly debridement was performed of synovitic tissue to the  anterior and anterior medial aspects of the ankle joint.  The articular cartilage appeared to be intact except for the area that was seen previously and seen on the MRI at the medial gutter area of the talus.  The loose cartilage was resected and suctioned out with the shaver.  The subchondral bone appeared to be hard in nature and did not appear to be soft and only the cartilage appeared to be damaged.  After the cartilage was debrided and resected it was measured to be approximately 1 cm x 0.8 cm.  The microfracture pick was then used to create microfracture holes through the osteochondral defect.  Approximately 4-5 holes were made in this area to stimulate fibrocartilage growth.  After this was performed mild debridement was performed with the cartilage with a shaver.  Pictures then taken during this time as well as throughout the remainder the case as well as before this time showing the ankle joint.  The ankle was then completely dried out removing all fluid with cotton tip applicators and with the suction.  At this time the bio cartilage material was then prepared and injected through the anterior medial portal into the area of the osteochondral defect.  A spatula type instrument was used to pack this area down after the ankle was completely dry.  It appeared to fit well in the area and contoured to the area well of the osteochondral defect and appeared to be a level surface overall.  At this time fibrin glue was then used to seal the area.  The defect appeared to be well contained and the cartilage appeared to be in place after the fibrin glue was left in place and the ankle was brought through range of motion.  The skin incisions were well coapted and repaired with 3-0 nylon in a combination of simple and horizontal mattress type stitching.  A postoperative dressing was applied consisting of Xeroform to the incision lines followed by 4 x 4 gauze, Kerlix, web roll, posterior splint, Ace wrap.  Patient  tolerated the procedure and anesthesia well was transferred to recovery room vital signs stable vascular status intact all toes of the left foot.  Patient will be discharged home with the appropriate orders and follow-up instructions.  Patient instructed to remain nonweightbearing to left lower extremity at all times.  COMPLICATIONS: None  CONDITION: Good, stable  Caroline More, DPM

## 2021-03-19 NOTE — Anesthesia Procedure Notes (Signed)
Procedure Name: LMA Insertion Date/Time: 03/19/2021 7:33 AM Performed by: Silvana Newness, CRNA Pre-anesthesia Checklist: Patient identified, Patient being monitored, Timeout performed, Emergency Drugs available and Suction available Patient Re-evaluated:Patient Re-evaluated prior to induction Oxygen Delivery Method: Circle system utilized Preoxygenation: Pre-oxygenation with 100% oxygen Induction Type: IV induction Ventilation: Mask ventilation without difficulty LMA: LMA inserted LMA Size: 4.0 Tube type: Oral Number of attempts: 1 Placement Confirmation: positive ETCO2 and breath sounds checked- equal and bilateral Tube secured with: Tape Dental Injury: Teeth and Oropharynx as per pre-operative assessment

## 2021-03-19 NOTE — Anesthesia Postprocedure Evaluation (Signed)
Anesthesia Post Note  Patient: Laurie Higgins  Procedure(s) Performed: ANKLE ARTHROSCOPY/OCD Repair/Debridement; Extensive (Left: Ankle)     Patient location during evaluation: PACU Anesthesia Type: General and Regional Level of consciousness: awake Pain management: pain level controlled Vital Signs Assessment: post-procedure vital signs reviewed and stable Respiratory status: respiratory function stable Cardiovascular status: stable Postop Assessment: no signs of nausea or vomiting Anesthetic complications: no   No notable events documented.  Veda Canning

## 2021-03-19 NOTE — H&P (Signed)
HISTORY AND PHYSICAL INTERVAL NOTE:  03/19/2021  7:10 AM  Laurie Higgins  has presented today for surgery, with the diagnosis of M95.8- LEFT Osteochondral defect of ankle, LEFT ankle synovitis.  The various methods of treatment have been discussed with the patient.  No guarantees were given.  After consideration of risks, benefits and other options for treatment, the patient has consented to surgery.  I have reviewed the patients' chart and labs.    PROCEDURE: LEFT ANKLE Ankle arthroscopy with extensive debridement Microfracture talus OCD Possible application of biocartilage   A history and physical examination was performed in my office.  The patient was reexamined.  There have been no changes to this history and physical examination.  Caroline More, DPM

## 2021-03-19 NOTE — Transfer of Care (Signed)
Immediate Anesthesia Transfer of Care Note  Patient: Laurie Higgins  Procedure(s) Performed: ANKLE ARTHROSCOPY/OCD Repair/Debridement; Extensive (Left: Ankle)  Patient Location: PACU  Anesthesia Type: General  Level of Consciousness: awake, alert  and patient cooperative  Airway and Oxygen Therapy: Patient Spontanous Breathing and Patient connected to supplemental oxygen  Post-op Assessment: Post-op Vital signs reviewed, Patient's Cardiovascular Status Stable, Respiratory Function Stable, Patent Airway and No signs of Nausea or vomiting  Post-op Vital Signs: Reviewed and stable  Complications: No notable events documented.

## 2021-03-19 NOTE — Anesthesia Procedure Notes (Signed)
Anesthesia Regional Block: Adductor canal block   Pre-Anesthetic Checklist: , timeout performed,  Correct Patient, Correct Site, Correct Laterality,  Correct Procedure, Correct Position, site marked,  Risks and benefits discussed,  Surgical consent,  Pre-op evaluation,  At surgeon's request and post-op pain management  Laterality: Left  Prep: chloraprep       Needles:  Injection technique: Single-shot  Needle Type: Echogenic Needle     Needle Length: 9cm  Needle Gauge: 21     Additional Needles:   Procedures:,,,, ultrasound used (permanent image in chart),,    Narrative:  Start time: 03/19/2021 7:12 AM End time: 03/19/2021 7:14 AM Injection made incrementally with aspirations every 5 mL.  Performed by: Personally  Anesthesiologist: Veda Canning, MD  Additional Notes: Functioning IV was confirmed and monitors applied. Ultrasound guidance: relevant anatomy identified, needle position confirmed, local anesthetic spread visualized around nerve(s)., vascular puncture avoided.  Image printed for medical record.  Negative aspiration and no paresthesias; incremental administration of local anesthetic. The patient tolerated the procedure well. Vitals signs recorded in RN notes.

## 2021-03-19 NOTE — Anesthesia Procedure Notes (Signed)
Anesthesia Regional Block: Popliteal block   Pre-Anesthetic Checklist: , timeout performed,  Correct Patient, Correct Site, Correct Laterality,  Correct Procedure, Correct Position, site marked,  Risks and benefits discussed,  Surgical consent,  Pre-op evaluation,  At surgeon's request and post-op pain management  Laterality: Left  Prep: chloraprep       Needles:  Injection technique: Single-shot  Needle Type: Echogenic Needle     Needle Length: 9cm  Needle Gauge: 21     Additional Needles:   Procedures:,,,, ultrasound used (permanent image in chart),,    Narrative:  Start time: 03/19/2021 7:06 AM End time: 03/19/2021 7:10 AM Injection made incrementally with aspirations every 5 mL.  Performed by: Personally  Anesthesiologist: Veda Canning, MD  Additional Notes: Functioning IV was confirmed and monitors applied. Ultrasound guidance: relevant anatomy identified, needle position confirmed, local anesthetic spread visualized around nerve(s)., vascular puncture avoided.  Image printed for medical record.  Negative aspiration and no paresthesias; incremental administration of local anesthetic. The patient tolerated the procedure well. Vitals signs recorded in RN notes.

## 2021-03-19 NOTE — Anesthesia Preprocedure Evaluation (Addendum)
Anesthesia Evaluation  Patient identified by MRN, date of birth, ID band Patient awake    Reviewed: Allergy & Precautions, NPO status   Airway Mallampati: II  TM Distance: >3 FB     Dental   Pulmonary former smoker,    Pulmonary exam normal        Cardiovascular  Rhythm:Regular Rate:Normal  HLD   Neuro/Psych    GI/Hepatic negative GI ROS,   Endo/Other  Hypothyroidism BMI 35  Renal/GU      Musculoskeletal  (+) Arthritis ,   Abdominal   Peds  Hematology   Anesthesia Other Findings Hx breast cancer on left side  Reproductive/Obstetrics                             Anesthesia Physical Anesthesia Plan  ASA: 2  Anesthesia Plan: General and Regional   Post-op Pain Management:  Regional for Post-op pain   Induction: Intravenous  PONV Risk Score and Plan: Treatment may vary due to age or medical condition  Airway Management Planned: LMA  Additional Equipment:   Intra-op Plan:   Post-operative Plan:   Informed Consent: I have reviewed the patients History and Physical, chart, labs and discussed the procedure including the risks, benefits and alternatives for the proposed anesthesia with the patient or authorized representative who has indicated his/her understanding and acceptance.     Dental advisory given  Plan Discussed with: CRNA  Anesthesia Plan Comments:       Anesthesia Quick Evaluation

## 2021-03-19 NOTE — Progress Notes (Signed)
Assisted Veda Canning, ANMD  with left, ultrasound guided, popliteal/saphenous block. Side rails up, monitors on throughout procedure. See vital signs in flow sheet. Tolerated Procedure well.

## 2021-03-20 ENCOUNTER — Encounter: Payer: Self-pay | Admitting: Podiatry

## 2021-04-30 IMAGING — MR MR LUMBAR SPINE W/O CM
5 series · 31 of 48 positions shown · non-contrast
Comparison: None.

CLINICAL DATA: Sudden onset low back pain with left leg pain 1
week.

EXAM:
MRI LUMBAR SPINE WITHOUT CONTRAST
TECHNIQUE: Multiplanar, multisequence MR imaging of the lumbar spine was
performed. No intravenous contrast was administered.

[Series 5: T2 · sagittal · 4.0mm · 0.81mm/px · 6 of 17 slices shown (1 of 2)]
[im 1/17]
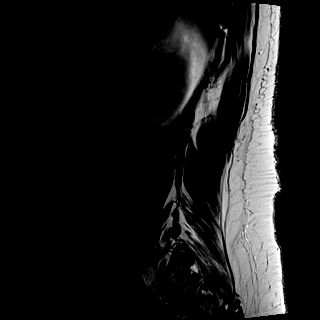
[im 4/17]
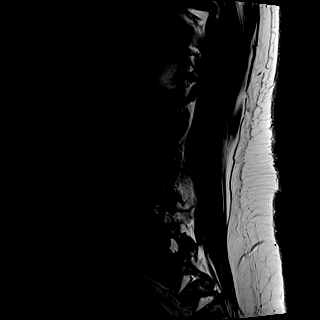
[im 7/17]
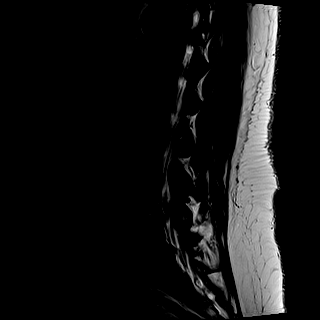
[im 10/17]
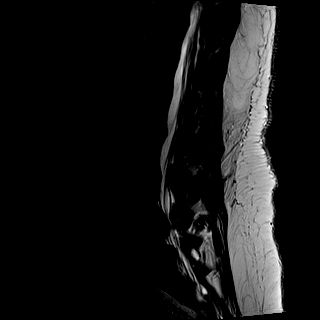
[im 13/17]
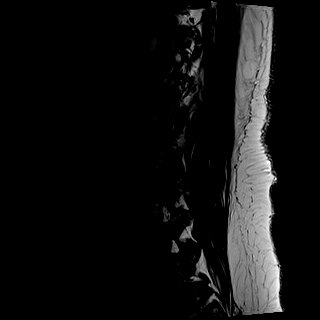
[im 17/17]
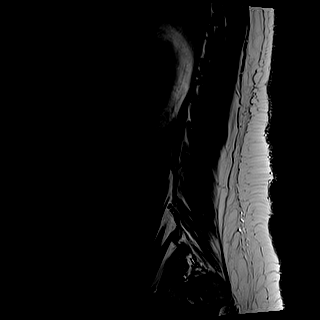

[Series 6: T1 · sagittal · 4.0mm · 0.81mm/px · 7 of 17 slices shown (1 of 2)]
[im 1/17]
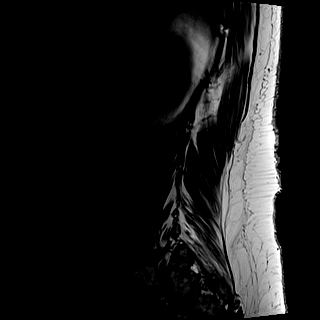
[im 3/17]
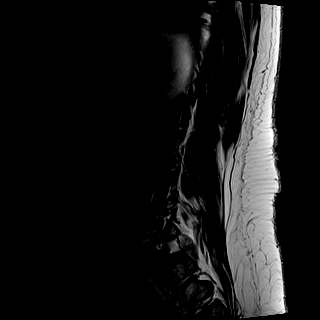
[im 6/17]
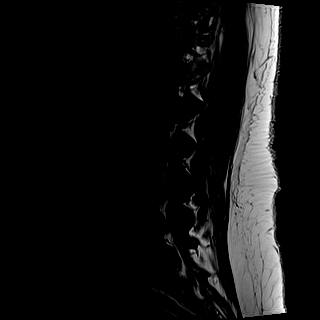
[im 9/17]
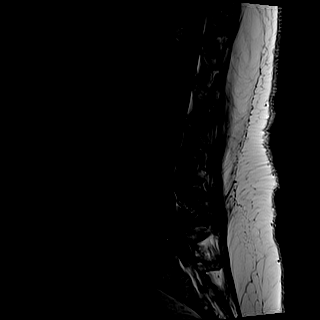
[im 11/17]
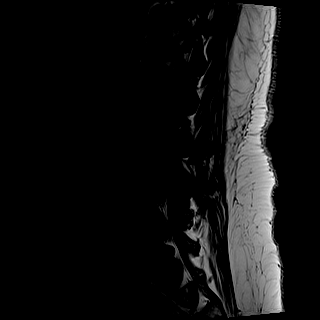
[im 14/17]
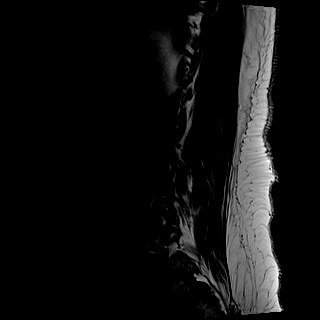
[im 17/17]
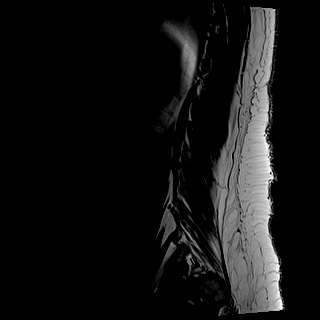

[Series 7: STIR · sagittal · 4.0mm · 0.41mm/px · 2 of 17 slices shown]
[im 1/17]
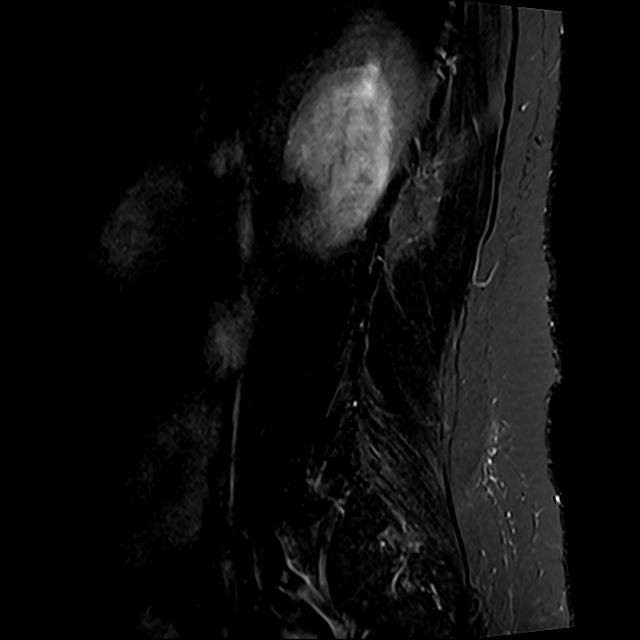
[im 3/17]
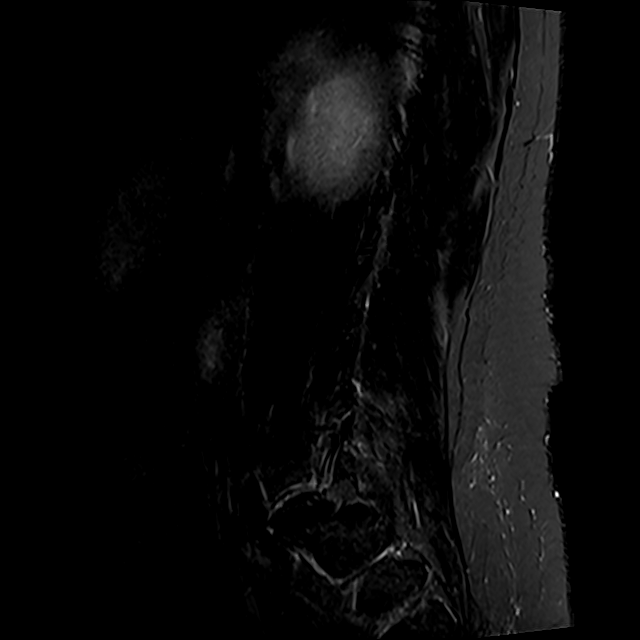

[Series 8: T2 · axial · 4.0mm · 0.78mm/px · z∈[-78,+120]mm · 8 of 35 slices shown (2 of 2)]
[im 1/35]
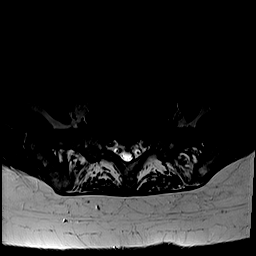
[im 6/35]
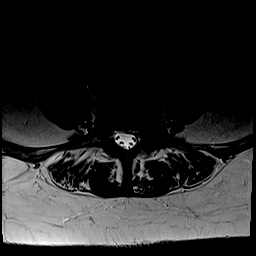
[im 11/35]
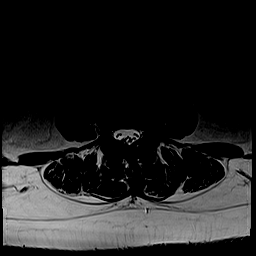
[im 16/35]
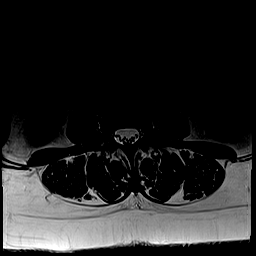
[im 19/35]
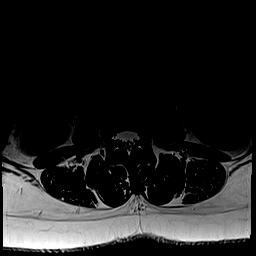
[im 24/35]
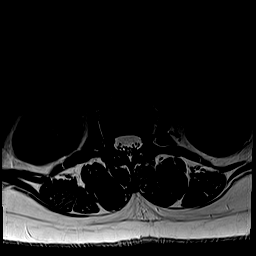
[im 29/35]
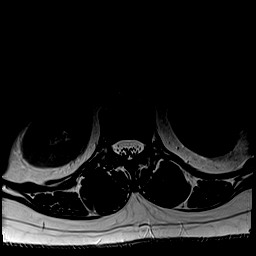
[im 35/35]
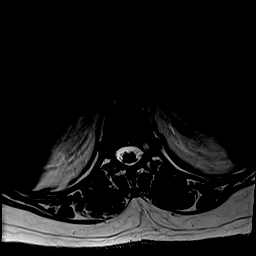

[Series 9: T1 · axial · 4.0mm · 0.39mm/px · z∈[-78,+120]mm · 8 of 35 slices shown (2 of 2)]
[im 1/35]
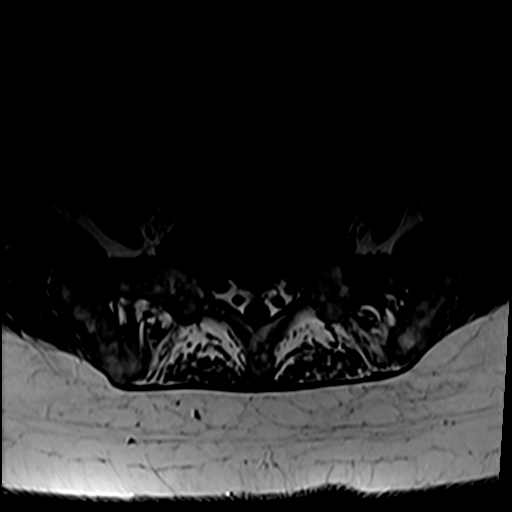
[im 6/35]
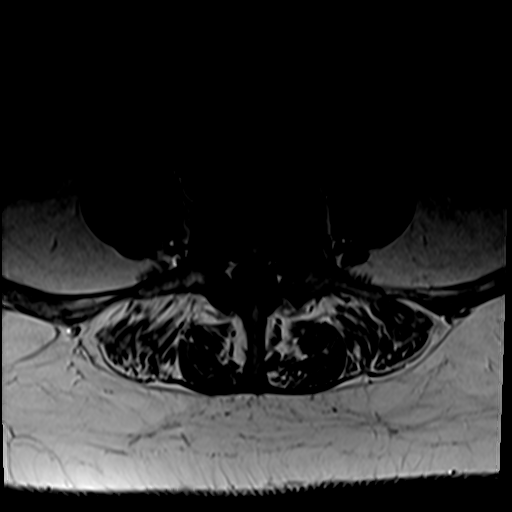
[im 11/35]
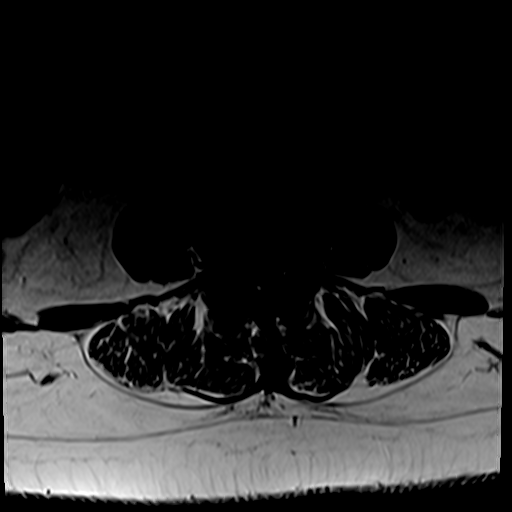
[im 16/35]
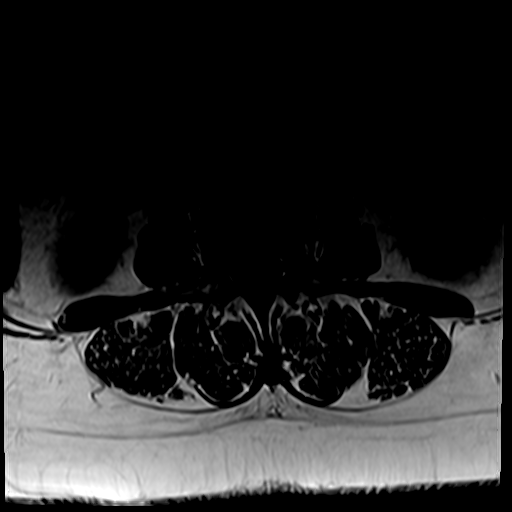
[im 19/35]
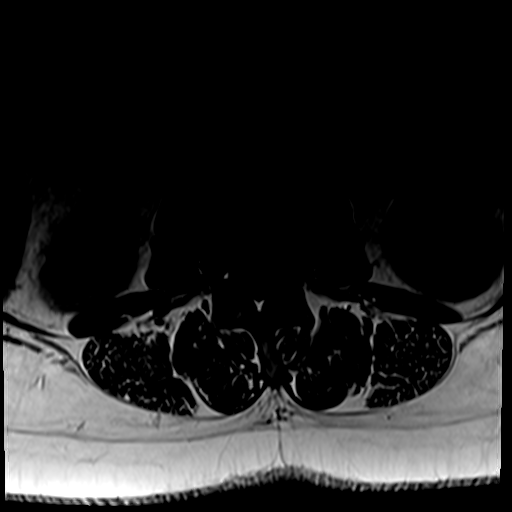
[im 24/35]
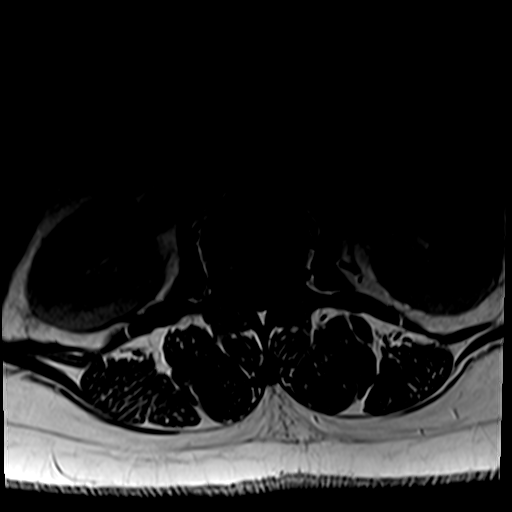
[im 29/35]
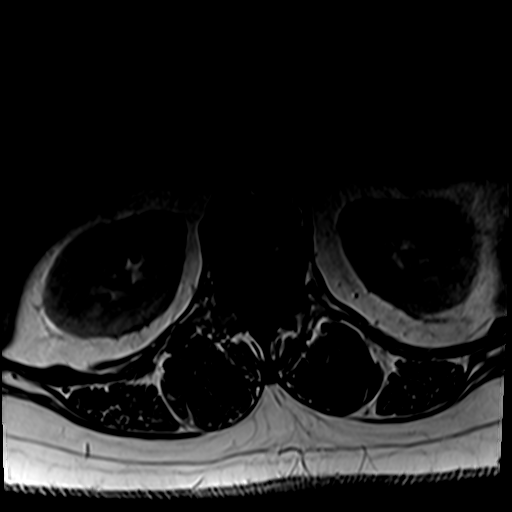
[im 35/35]
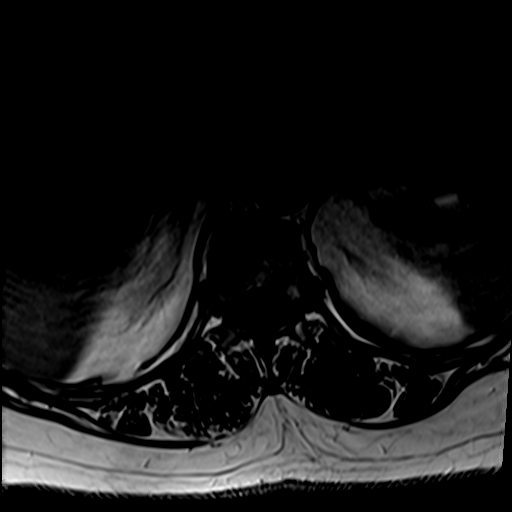

[31 of 48 positions shown; findings below may reference images not displayed]

FINDINGS: Segmentation:  Normal

Alignment:  Slight retrolisthesis L2-3 otherwise normal alignment.

Vertebrae: Negative for fracture or mass. Hemangioma L1 and S1
vertebral bodies.

Conus medullaris and cauda equina: Conus extends to the L1-2 level.
Conus and cauda equina appear normal.

Paraspinal and other soft tissues: Negative for paraspinous mass or
adenopathy.

Disc levels:

L1-2: Negative

L2-3: Mild retrolisthesis. Mild disc bulging and mild facet
degeneration without significant stenosis.

L3-4: Disc degeneration with diffuse disc bulging and moderate facet
hypertrophy bilaterally. Small left foraminal disc protrusion with
displacement of the left L3 nerve root. No nerve root compression.
Mild spinal stenosis and mild subarticular stenosis on the left.

L4-5: Disc degeneration with disc space narrowing. Shallow central
disc protrusion. Extruded disc fragment on the left with downgoing
disc material and impingement of the left L5 nerve root. Mild facet
degeneration on the left with moderate to severe subarticular
stenosis on the left.

L5-S1: Advanced disc degeneration with disc space narrowing. Diffuse
endplate spurring and small central disc protrusion. No significant
neural impingement.
IMPRESSION: Small left foraminal disc protrusion L3-4 displacing the left L3
nerve root lateral to the foramen. Mild spinal stenosis and mild
subarticular stenosis on the left

Shallow central disc protrusion L4-5. Extruded moderately large disc
fragment on the left with downgoing disc material at L4-5 causing
impingement left L5 nerve root. Moderate to severe left subarticular
stenosis.
